# Patient Record
Sex: Male | Born: 1960 | Marital: Single | Smoking: Never smoker
Health system: Southern US, Community
[De-identification: ages and names within clinical notes are randomized; demographics above are authoritative.]

## PROBLEM LIST (undated history)

## (undated) DIAGNOSIS — M19011 Primary osteoarthritis, right shoulder: Secondary | ICD-10-CM

## (undated) DIAGNOSIS — N400 Enlarged prostate without lower urinary tract symptoms: Secondary | ICD-10-CM

## (undated) DIAGNOSIS — E785 Hyperlipidemia, unspecified: Secondary | ICD-10-CM

## (undated) DIAGNOSIS — N289 Disorder of kidney and ureter, unspecified: Secondary | ICD-10-CM

## (undated) DIAGNOSIS — D860 Sarcoidosis of lung: Secondary | ICD-10-CM

## (undated) DIAGNOSIS — I358 Other nonrheumatic aortic valve disorders: Secondary | ICD-10-CM

## (undated) DIAGNOSIS — C641 Malignant neoplasm of right kidney, except renal pelvis: Secondary | ICD-10-CM

## (undated) DIAGNOSIS — N189 Chronic kidney disease, unspecified: Secondary | ICD-10-CM

## (undated) DIAGNOSIS — I10 Essential (primary) hypertension: Secondary | ICD-10-CM

## (undated) HISTORY — PX: PELVIS CLOSED REDUCTION: SHX1023

## (undated) HISTORY — PX: KIDNEY SURGERY: SHX687

---

## 2020-08-25 ENCOUNTER — Other Ambulatory Visit: Payer: Self-pay | Admitting: Podiatry

## 2020-08-25 DIAGNOSIS — M79604 Pain in right leg: Secondary | ICD-10-CM

## 2020-08-25 DIAGNOSIS — M7989 Other specified soft tissue disorders: Secondary | ICD-10-CM

## 2020-08-26 ENCOUNTER — Ambulatory Visit: Admission: RE | Admit: 2020-08-26 | Payer: Commercial Managed Care - PPO | Source: Ambulatory Visit

## 2020-08-26 ENCOUNTER — Other Ambulatory Visit: Payer: Self-pay | Admitting: Podiatry

## 2020-08-26 ENCOUNTER — Ambulatory Visit
Admission: RE | Admit: 2020-08-26 | Discharge: 2020-08-26 | Disposition: A | Discharge status: Home / Self Care | Payer: Commercial Managed Care - PPO | Source: Ambulatory Visit | Attending: Podiatry | Admitting: Podiatry

## 2020-08-26 ENCOUNTER — Other Ambulatory Visit: Payer: Self-pay

## 2020-08-26 DIAGNOSIS — M79605 Pain in left leg: Secondary | ICD-10-CM

## 2020-08-26 DIAGNOSIS — M7989 Other specified soft tissue disorders: Secondary | ICD-10-CM | POA: Insufficient documentation

## 2020-08-26 DIAGNOSIS — M79604 Pain in right leg: Secondary | ICD-10-CM | POA: Insufficient documentation

## 2021-10-09 ENCOUNTER — Encounter (HOSPITAL_COMMUNITY): Payer: Self-pay

## 2021-10-09 ENCOUNTER — Emergency Department (HOSPITAL_COMMUNITY): Payer: Commercial Managed Care - PPO

## 2021-10-09 ENCOUNTER — Emergency Department (HOSPITAL_COMMUNITY)
Admission: EM | Admit: 2021-10-09 | Discharge: 2021-10-09 | Disposition: A | Discharge status: Home / Self Care | Payer: Commercial Managed Care - PPO | Attending: Emergency Medicine | Admitting: Emergency Medicine

## 2021-10-09 ENCOUNTER — Other Ambulatory Visit: Payer: Self-pay

## 2021-10-09 DIAGNOSIS — W01198A Fall on same level from slipping, tripping and stumbling with subsequent striking against other object, initial encounter: Secondary | ICD-10-CM | POA: Diagnosis not present

## 2021-10-09 DIAGNOSIS — R0781 Pleurodynia: Secondary | ICD-10-CM | POA: Insufficient documentation

## 2021-10-09 DIAGNOSIS — W19XXXA Unspecified fall, initial encounter: Secondary | ICD-10-CM

## 2021-10-09 DIAGNOSIS — M25511 Pain in right shoulder: Secondary | ICD-10-CM | POA: Insufficient documentation

## 2021-10-09 MED ORDER — CYCLOBENZAPRINE HCL 10 MG PO TABS
10.0000 mg | ORAL_TABLET | Freq: Once | ORAL | Status: DC
  Filled 2021-10-09: qty 1

## 2021-10-09 MED ORDER — OXYCODONE-ACETAMINOPHEN 5-325 MG PO TABS: 1.0000 | ORAL_TABLET | Freq: Three times a day (TID) | ORAL | 0 refills | Status: AC | PRN

## 2021-10-09 NOTE — ED Provider Notes (Signed)
?[place] EMERGENCY DEPARTMENT ?Provider Note ? ? ?CSN: [id number] ?Arrival date & time: 10/09/21  1015 ? ?  ? ?History ? ?Chief Complaint  ?Patient presents with  ? Fall  ? ? ?Dalton Guerrero is a 61 y.o. male. ? ?HPI ? ?Patient without  significant medical history presents with complaints of a mechanical fall.  Patient states on Friday he was holding a door for someone and his foot got caught on the carpet causing him to fall onto his right side.  He denies hitting his head, losing conscious, he is not on anticoag's.  He states that after the fall he has been having some right-sided rib pain and right-sided shoulder pain, pain has gotten worse,  worse with movement improved with rest.  He denies chest pain shortness of breath pleuritic chest pain denies any paresthesias in the upper extremities.  He does not endorse any neck back pain stomach pain nausea vomiting, no bloody sputum.  He has had nothing approved for pain at this time. ? ?I reviewed patient's chart patient is not on anticoag's. ? ?Home Medications ?Prior to Admission medications   ?Medication Sig Start Date End Date Taking? Authorizing Provider  ?oxyCODONE-acetaminophen (PERCOCET/ROXICET) 5-325 MG tablet Take 1 tablet by mouth every 8 (eight) hours as needed for up to 3 days for severe pain. 10/09/21 10/12/21 Yes Carroll Sage, PA-C  ?   ? ?Allergies    ?Patient has no known allergies.   ? ?Review of Systems   ?Review of Systems  ?Constitutional:  Negative for chills and fever.  ?Respiratory:  Negative for shortness of breath.   ?Cardiovascular:  Negative for chest pain.  ?Gastrointestinal:  Negative for abdominal pain, diarrhea and vomiting.  ?Musculoskeletal:   ?     Right-sided rib pain as well as right shoulder pain.  ?Neurological:  Negative for headaches.  ? ?Physical Exam ?Updated Vital Signs ?BP (!) 140/91   Pulse 81   Temp 98.2 ?F (36.8 ?C) (Oral)   Resp 15   Ht 5\' 11"  (1.803 m)   Wt 100.7 kg   SpO2 99%   BMI 30.96 kg/m?   ?Physical Exam ?Vitals and nursing note reviewed.  ?Constitutional:   ?   General: He is not in acute distress. ?   Appearance: He is not ill-appearing.  ?HENT:  ?   Head: Normocephalic and atraumatic.  ?   Comments: No deformity of the head present no raccoon eyes or battle sign noted. ?   Nose: No congestion.  ?   Mouth/Throat:  ?   Comments: No trismus no torticollis no oral trauma ?Eyes:  ?   Conjunctiva/sclera: Conjunctivae normal.  ?Cardiovascular:  ?   Rate and Rhythm: Normal rate and regular rhythm.  ?   Pulses: Normal pulses.  ?   Heart sounds: No murmur heard. ?  No friction rub. No gallop.  ?Pulmonary:  ?   Effort: No respiratory distress.  ?   Breath sounds: No wheezing, rhonchi or rales.  ?   Comments: Chest was palpated he had noted tenderness on the right anterior midclavicular the fifth and sixth rib no crepitus no flail chest ?Abdominal:  ?   Palpations: Abdomen is soft.  ?   Tenderness: There is no abdominal tenderness. There is no right CVA tenderness or left CVA tenderness.  ?Musculoskeletal:  ?   Right lower leg: No edema.  ?   Left lower leg: No edema.  ?   Comments: Spine was palpated was nontender to palpation  no step-off deformities noted patient's right shoulder was palpated he had noted tenderness on the anterior aspect of the right humeral head no crepitus present.  He has full range of motion in all 4 extremities neurovascular fully intact.  No pelvis instability no leg shortening  ?Skin: ?   General: Skin is warm and dry.  ?Neurological:  ?   Mental Status: He is alert.  ?   Comments: No facial asymmetry no difficulty with word finding following two-step commands no regular weakness present  ?Psychiatric:     ?   Mood and Affect: Mood normal.  ? ? ?ED Results / Procedures / Treatments   ?Labs ?(all labs ordered are listed, but only abnormal results are displayed) ?Labs Reviewed - No data to display ? ?EKG ?None ? ?Radiology ?DG Ribs Unilateral W/Chest Right ? ?Result Date:  10/09/2021 ?CLINICAL DATA:  Fall with chest and shoulder pain. EXAM: RIGHT RIBS AND CHEST - 3+ VIEW COMPARISON:  Report from chest radiograph dated 09/04/2016. FINDINGS: No fracture or other bone lesions are seen involving the ribs. There is mild left basilar atelectasis. The right lung is clear. There is no evidence of pneumothorax or pleural effusion. Both lungs are clear. Heart size and mediastinal contours are within normal limits. Vascular calcifications are seen in the aortic arch. IMPRESSION: Mild left basilar atelectasis.  No acute osseous injury. Aortic Atherosclerosis (ICD10-I70.0). Electronically Signed   By: Zerita Boers M.D.   On: 10/09/2021 12:21  ? ?DG Shoulder Right ? ?Result Date: 10/09/2021 ?CLINICAL DATA:  Fall, shoulder pain EXAM: RIGHT SHOULDER - 2+ VIEW COMPARISON:  None. FINDINGS: There is no acute fracture or dislocation. Glenohumeral and acromioclavicular alignment is maintained. There is mild degenerative change about the [facility] joint. The glenohumeral joint appears preserved. The soft tissues are unremarkable. IMPRESSION: No acute fracture or dislocation. Electronically Signed   By: Valetta Mole M.D.   On: 10/09/2021 12:20   ? ?Procedures ?Procedures  ? ? ?Medications Ordered in ED ?Medications  ?cyclobenzaprine (FLEXERIL) tablet 10 mg (10 mg Oral Patient Refused/Not Given 10/09/21 1133)  ? ? ?ED Course/ Medical Decision Making/ A&P ?  ?                        ?Medical Decision Making ?Amount and/or Complexity of Data Reviewed ?Radiology: ordered. ? ?Risk ?Prescription drug management. ? ? ?This patient presents to the ED for concern of fall, this involves an extensive number of treatment options, and is a complaint that carries with it a high risk of complications and morbidity.  The differential diagnosis includes fracture, dislocation, pneumothorax ? ? ? ?Additional history obtained: ? ?Additional history obtained from electronic medical record ?External records from outside source obtained  and reviewed including N/A ? ? ?Co morbidities that complicate the patient evaluation ? ?N/A ? ?Social Determinants of Health: ? ?N/A ? ? ? ?Lab Tests: ? ?I Ordered, and personally interpreted labs.  The pertinent results include: N/A ? ? ?Imaging Studies ordered: ? ?I ordered imaging studies including x-ray of the right shoulder and right ribs ?I independently visualized and interpreted imaging which showed imaging negative for acute findings ?I agree with the radiologist interpretation ? ? ?Cardiac Monitoring: ? ?The patient was maintained on a cardiac monitor.  I personally viewed and interpreted the cardiac monitored which showed an underlying rhythm of: N/A ? ? ?Medicines ordered and prescription drug management: ? ?I ordered medication including Flexeril for pain ?I have reviewed the patients home  medicines and have made adjustments as needed ? ?Critical Interventions: ? ?N/A ? ? ?Reevaluation: ? ?Presents every mechanical fall I am concerned for possible orthopedic injury will obtain imaging of the right shoulder and ribs for further evaluation. ? ?Reassessed updated on imaging lab work imaging patient is ready for discharge. ? ?Consultations Obtained: ? ?N/A ? ? ? ?Test Considered: ? ?CT of chest will defer/suspicion for multiple rib fractures are very low at this time, he is nontachypneic nonhypoxic tenderness as well as over fifth and sixth rib.  ? ? ? ?Rule out ?low suspicion for intracranial head bleed as patient denies loss of conscious, is not on anticoagulant, she does not endorse headaches, paresthesia/weakness in the upper and lower extremities, no focal deficits present on my exam.  Low suspicion for spinal cord abnormality or spinal fracture spine was palpated was nontender to palpation, patient has full range of motion in the upper and lower extremities.  Low suspicion for pneumothorax as lung sounds are clear bilaterally, x-ray is negative for acute findings.  Low suspicion for intra-abdominal  trauma as abdomen soft nontender to palpation.  Low suspicion for orthopedic injury as imaging is negative for acute findings. ? ? ? ? ?Dispostion and problem list ? ?After consideration of the diagnostic results and the patients

## 2021-10-09 NOTE — Discharge Instructions (Signed)
Imaging was negative for acute findings, I suspect you  have a bruised rib, I given a incentive spirometer please use 3 times daily for next 3 weeks as this will prevent pneumonia.  I have given you some pain medications do not take this while taking tramadol, I recommend taking 1 or the other. ? ?I have given you a short course of narcotics please take as prescribed.  This medication can make you drowsy do not consume alcohol or operate heavy machinery when taking this medication.  This medication is Tylenol in it do not take Tylenol and take this medication.   ? ?Please follow-up with your PCP in 3 weeks time for reevaluation. ? ?Come back to the emergency department if you develop chest pain, shortness of breath, severe abdominal pain, uncontrolled nausea, vomiting, diarrhea. ? ?

## 2021-10-09 NOTE — ED Triage Notes (Signed)
Pt states he was at work Friday and tripped and fell over a floor mat. Pt states since the fall he has been experiencing pain to right side of ribs, right should, bilateral elbows, right knee, and right ankle. Did not hit his head, no loc, no blood thinners.  ?

## 2021-10-09 NOTE — ED Notes (Signed)
IS: 2200 ?

## 2021-12-29 ENCOUNTER — Ambulatory Visit: Payer: Commercial Managed Care - PPO | Attending: Family Medicine

## 2022-05-15 ENCOUNTER — Encounter (HOSPITAL_COMMUNITY): Payer: Self-pay

## 2022-05-15 ENCOUNTER — Emergency Department (HOSPITAL_COMMUNITY)
Admission: EM | Admit: 2022-05-15 | Discharge: 2022-05-15 | Discharge status: Against Medical Advice | Payer: BLUE CROSS/BLUE SHIELD | Attending: Emergency Medicine | Admitting: Emergency Medicine

## 2022-05-15 ENCOUNTER — Emergency Department (HOSPITAL_COMMUNITY): Payer: BLUE CROSS/BLUE SHIELD

## 2022-05-15 DIAGNOSIS — I10 Essential (primary) hypertension: Secondary | ICD-10-CM | POA: Insufficient documentation

## 2022-05-15 DIAGNOSIS — Z20822 Contact with and (suspected) exposure to covid-19: Secondary | ICD-10-CM | POA: Insufficient documentation

## 2022-05-15 DIAGNOSIS — Z5329 Procedure and treatment not carried out because of patient's decision for other reasons: Secondary | ICD-10-CM | POA: Diagnosis not present

## 2022-05-15 DIAGNOSIS — Z85528 Personal history of other malignant neoplasm of kidney: Secondary | ICD-10-CM | POA: Insufficient documentation

## 2022-05-15 DIAGNOSIS — R0602 Shortness of breath: Secondary | ICD-10-CM | POA: Insufficient documentation

## 2022-05-15 DIAGNOSIS — R531 Weakness: Secondary | ICD-10-CM | POA: Insufficient documentation

## 2022-05-15 DIAGNOSIS — R059 Cough, unspecified: Secondary | ICD-10-CM | POA: Diagnosis not present

## 2022-05-15 DIAGNOSIS — Z862 Personal history of diseases of the blood and blood-forming organs and certain disorders involving the immune mechanism: Secondary | ICD-10-CM

## 2022-05-15 DIAGNOSIS — Z8709 Personal history of other diseases of the respiratory system: Secondary | ICD-10-CM | POA: Diagnosis not present

## 2022-05-15 HISTORY — DX: Essential (primary) hypertension: I10

## 2022-05-15 LAB — RESP PANEL BY RT-PCR (FLU A&B, COVID) ARPGX2
Influenza A by PCR: NEGATIVE
Influenza B by PCR: NEGATIVE
SARS Coronavirus 2 by RT PCR: NEGATIVE

## 2022-05-15 NOTE — ED Provider Notes (Signed)
[facility] EMERGENCY DEPARTMENT Provider Note   CSN: [id number] Arrival date & time: 05/15/22  1003     History  Chief Complaint  Patient presents with   Weakness    Dalton Guerrero is a 61 y.o. male.  61 year old male with a history of renal cell carcinoma status post nephrectomy on 03/28/2022 (not currently on chemo or radiation), pulmonary sarcoidosis, ANCA vasculitis who presents to the emergency department with cough productive of brown sputum.  Patient states that last Wednesday he started developing generalized weakness and cough.  Says that it started after he went to a store where he thinks he could have been exposed to [place].  Also reports changes in his taste and smell since then.  Says that he has had fevers and chills.  Denies any chest pain.  Says that he decided to come in today for evaluation with his persistent symptoms.  No lower extremity swelling.  Follows with Dr. Candis Schatz from [facility] rheumatology and Dr. Wynonia Lawman from pulmonology.  Says he is not currently on any medications for his sarcoidosis or vasculitis that he is aware of.  Also reports mild shortness of breath.   Past Medical History:  Diagnosis Date   Hypertension       Home Medications Prior to Admission medications   Not on File      Allergies    Patient has no known allergies.    Review of Systems   Review of Systems  Physical Exam Updated Vital Signs BP 107/82   Pulse 80   Temp 98.4 F (36.9 C) (Oral)   SpO2 100%  Physical Exam Vitals and nursing note reviewed.  Constitutional:      General: He is not in acute distress.    Appearance: He is well-developed.  HENT:     Head: Normocephalic and atraumatic.     Right Ear: External ear normal.     Left Ear: External ear normal.     Nose: Nose normal.  Eyes:     Extraocular Movements: Extraocular movements intact.     Conjunctiva/sclera: Conjunctivae normal.     Pupils: Pupils are equal, round, and reactive to light.  Cardiovascular:      Rate and Rhythm: Normal rate and regular rhythm.     Heart sounds: Normal heart sounds.  Pulmonary:     Effort: Pulmonary effort is normal. No respiratory distress.     Breath sounds: Normal breath sounds.  Musculoskeletal:        General: No swelling.     Cervical back: Normal range of motion and neck supple.     Right lower leg: No edema.     Left lower leg: No edema.  Skin:    General: Skin is warm and dry.  Neurological:     Mental Status: He is alert and oriented to person, place, and time. Mental status is at baseline.  Psychiatric:        Mood and Affect: Mood normal.        Behavior: Behavior normal.     ED Results / Procedures / Treatments   Labs (all labs ordered are listed, but only abnormal results are displayed) Labs Reviewed  RESP PANEL BY RT-PCR (FLU A&B, COVID) [id number]    EKG None  Radiology No results found.  Procedures Procedures   Medications Ordered in ED Medications - No data to display  ED Course/ Medical Decision Making/ A&P  Medical Decision Making Amount and/or Complexity of Data Reviewed Radiology: ordered.   Dalton Guerrero is a 61 y.o. male with comorbidities that complicate the patient evaluation including renal cell carcinoma, pulmonary sarcoidosis, and ANCA vasculitis who presents with chief complaint of generalized fatigue and cough.  This patient presents to the ED for concern of complaints listed in HPI, this involves an extensive number of treatment options, and is a complaint that carries with it a high risk of complications and morbidity. Disposition including potential need for admission considered.   Initial Ddx:  Viral URI, pneumonia, sarcoidosis flare, PE, MI  MDM:  Feel the patient is likely having a viral URI or pneumonia given his symptoms.  Also could be worsening of his sarcoidosis.  Feel that pulmonary embolism is also on the differential given his cancer history.  Considered MI but feel  this is likely likely given the lack of chest discomfort and very mild shortness of breath.   Plan:  Labs COVID and flu EKG CTA  ED Summary/Re-evaluation:  Patient reevaluated after initial COVID and flu and chest x-ray were ordered from triage.  He is stable.  He was notified of his lung nodule.  Did inform the patient that we wanted to pursue further work-up with lab work and CTA but the patient eloped prior to finishing his work-up.  Patient was of sound mind and had capacity to leave the department at that time.  Dispo: Eloped   Records reviewed Outpatient Clinic Notes I independently reviewed the following imaging with scope of interpretation limited to determining acute life threatening conditions related to emergency care: Chest x-ray, which revealed no acute abnormality  I personally reviewed and interpreted cardiac monitoring: normal sinus rhythm   Final Clinical Impression(s) / ED Diagnoses Final diagnoses:  Cough, unspecified type  Shortness of breath  History of sarcoidosis    Rx / DC Orders ED Discharge Orders     None         Fransico Meadow, MD 05/17/22 1431

## 2022-05-15 NOTE — ED Triage Notes (Signed)
Patient having "weakness, funny taste in mouth, and not much of a sense of smell/taste." Patient notes he does not have any shortness of breath. Also notes coughing up green/brown sputum.

## 2022-07-06 ENCOUNTER — Ambulatory Visit (HOSPITAL_COMMUNITY): Payer: BLUE CROSS/BLUE SHIELD | Admitting: Occupational Therapy

## 2022-07-11 ENCOUNTER — Ambulatory Visit (HOSPITAL_COMMUNITY): Payer: BLUE CROSS/BLUE SHIELD | Attending: Family Medicine | Admitting: Occupational Therapy

## 2022-07-11 ENCOUNTER — Encounter (HOSPITAL_COMMUNITY): Payer: Self-pay | Admitting: Occupational Therapy

## 2022-07-11 DIAGNOSIS — R29898 Other symptoms and signs involving the musculoskeletal system: Secondary | ICD-10-CM | POA: Insufficient documentation

## 2022-07-11 DIAGNOSIS — M25511 Pain in right shoulder: Secondary | ICD-10-CM | POA: Diagnosis present

## 2022-07-11 DIAGNOSIS — R29818 Other symptoms and signs involving the nervous system: Secondary | ICD-10-CM | POA: Insufficient documentation

## 2022-07-11 DIAGNOSIS — G8929 Other chronic pain: Secondary | ICD-10-CM | POA: Diagnosis present

## 2022-07-11 DIAGNOSIS — M25632 Stiffness of left wrist, not elsewhere classified: Secondary | ICD-10-CM | POA: Insufficient documentation

## 2022-07-11 DIAGNOSIS — M25532 Pain in left wrist: Secondary | ICD-10-CM | POA: Diagnosis present

## 2022-07-11 NOTE — Patient Instructions (Signed)
Weight-bearing   1) Weight-bearing lean stretch: From a seated position on your bed or bench, prop yourself up on your affected arm by placing your affected arm about a foot away from your body. Then lean into it.  Hold for at least 1 minute    OR     2) Weight-bearing in standing:  Gently lean your body weight into your hand or fist. Keep the elbow as straight as possible. Hold for at least 1 minute.

## 2022-07-11 NOTE — Therapy (Signed)
OUTPATIENT OCCUPATIONAL THERAPY ORTHO EVALUATION  Patient Name: Dalton Guerrero MRN: [id number] DOB:1960/12/01, 61 y.o., male Today's Date: 07/11/2022  PCP: Dr. Gwen Pounds REFERRING PROVIDER: Bethann Berkshire, PA-C  END OF SESSION:  OT End of Session - 07/11/22 1056     Visit Number 1    Number of Visits 16    Date for OT Re-Evaluation 09/09/22   mini-reassessment 08/08/22   Authorization Type [employer]    OT Start Time 0935    OT Stop Time 1025    OT Time Calculation (min) 50 min    Activity Tolerance Patient tolerated treatment well    Behavior During Therapy WFL for tasks assessed/performed             Past Medical History:  Diagnosis Date   Hypertension    Past Surgical History:  Procedure Laterality Date   KIDNEY SURGERY Bilateral    Pt. 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   ONSET DATE: 02/28/22  REFERRING DIAG: right RC tear, left radial nerve palsy, left wrist contracture  THERAPY DIAG:  Pain in left wrist  Stiffness of left wrist, not elsewhere classified  Other symptoms and signs involving the nervous system  Other symptoms and signs involving the musculoskeletal system  Chronic right shoulder pain  Rationale for Evaluation and Treatment: Rehabilitation  SUBJECTIVE:   SUBJECTIVE STATEMENT: S: I fell asleep and woke up with my hand like this.  Pt accompanied by: self  PERTINENT HISTORY: Pt is a 61 y/o male presenting with right RC tear, left radial nerve palsy, and left wrist contracture. Pt presents in a standard prefabricated wrist splint today, minimal benefit to wrist and left hand. Pt fell several years ago landing on his right shoulder, then had surgery. In March, pt fell at work when tripping over a mat, then later tried to lift a box overhead and sustained a RC tear. Pt was scheduled for a reverse TSA, but has postponed due to the severity of deficits with his left hand and wrist. Pt has had a  nerve conduction study completed, pt reports he was told that it would be 6 months before he saw improvement. Currently, the pt's primary concern is his left hand and wrist, this is where he would like therapy to focus.   PRECAUTIONS: None  WEIGHT BEARING RESTRICTIONS: No  PAIN:  Are you having pain? Yes: NPRS scale: 9/10 Pain location: right shoulder Pain description: aching, sharp, sore Aggravating factors: movement, use, lifting Relieving factors: nothing  FALLS: Has patient fallen in last 6 months? Yes. Number of falls 2  PLOF: Independent  PATIENT GOALS: To get his left hand working again.   NEXT MD VISIT: unsure  OBJECTIVE:   HAND DOMINANCE: Left  ADLs: Overall ADLs: Pt reports he can wash the left side of his body and he can lift a lightweight milk jug with his left hand, otherwise he is unable to use his left hand functionally. He has been soaking his hand in epsom salts and hot water to stretch it.   FUNCTIONAL OUTCOME MEASURES: Quick Dash: 81.82  UPPER EXTREMITY ROM:     Active ROM Left eval  Elbow flexion 130  Elbow extension 0  Wrist flexion 50  Wrist extension 0  Wrist ulnar deviation 0  Wrist radial deviation 0  Wrist pronation 45  Wrist supination 90  (Blank rows = not tested)  Active ROM Left eval  Thumb MCP (0-60) 72  Thumb  IP (0-80) 21  Thumb Opposition to Small Finger  0  Index MCP (0-90)  72  Index PIP (0-100)  62  Index DIP (0-70)  30  Long MCP (0-90)  68  Long PIP (0-100)  62  Long DIP (0-70)  55  Ring MCP (0-90)  58  Ring PIP (0-100)  82  Ring DIP (0-70)  40  Little MCP (0-90)  74  Little PIP (0-100)  58  Little DIP (0-70)  45  (Blank rows = not tested)   UPPER EXTREMITY MMT:     MMT Left eval  Elbow flexion 5/5  Elbow extension 5/5  Wrist flexion 3-/5  Wrist extension 1/5  Wrist ulnar deviation 0/5  Wrist radial deviation 0/5  Wrist pronation 2/5  Wrist supination 3-/5  (Blank rows = not tested)  HAND  FUNCTION: Grip strength: Right: 68 lbs; Left: 10 lbs, Lateral pinch: Right: 13 lbs, Left: 6 lbs, and 3 point pinch: Right: 14 lbs, Left: 1 lbs  COORDINATION: Unable to complete due to deficits  SENSATION: Light touch: WFL  EDEMA: None noted  COGNITION: Overall cognitive status: Difficulty to assess due to: lack of sleep, emotional, tangential   TODAY'S TREATMENT:                                                                                                                              DATE: N/A-eval only     PATIENT EDUCATION: Education details: Weightbearing-adapted for leaning on forearm versus hand Person educated: Patient Education method: Explanation, Demonstration, and Handouts Education comprehension: verbalized understanding and returned demonstration  HOME EXERCISE PROGRAM: Eval: weightbearing  GOALS: Goals reviewed with patient? Yes  SHORT TERM GOALS: Target date: 08/08/22  Pt will be provided with and educated on HEP to improve mobility in LUE required for use during ADL completion.   Goal status: INITIAL  2.  Pt will be provided with and educated on splint wear and care for radial nerve palsy, demonstrating independence in donning and doffing.   Goal status: INITIAL  3.  Pt will increase left wrist strength to 3+/5 to improve ability to reach for items using his left hand and maintain a light grasp.   Goal status: INITIAL  4.  Pt will increase left grip strength by 5# and pinch strength by 2# to improve ability to grasp and maintain hold on tools for ADL completion such as a cup, toothbrush, clothing, etc.   Goal status: INITIAL    LONG TERM GOALS: Target date: 09/09/22  Pt will decrease pain in LUE to 3/10 or less to improve ability to sleep for 2+ consecutive hours without waking due to pain.   Goal status: INITIAL  2.  Pt will decrease LUE fascial restrictions to min amounts or less to improve mobility required for functional reaching tasks.    Goal status: INITIAL  3.  Pt will increase LUE A/ROM by at least 25 degrees to improve ability to use  LUE when reaching overhead or behind back during dressing and bathing tasks.   Goal status: INITIAL  4.  Pt will increase LUE strength to 4/5 or greater to improve ability to use LUE when lifting or carrying items during meal preparation/housework/yardwork tasks.   Goal status: INITIAL  5.  Pt will return to highest level of function using LUE as dominant during functional task completion.   Goal status: INITIAL      ASSESSMENT:  CLINICAL IMPRESSION: Patient is a 61 y.o. male who was seen today for occupational therapy evaluation for right RC tear, left radial nerve palsy, left wrist contracture. Pt with tangential speech and inability to maintain focus, requiring frequent redirection back to task. Pt reports radial nerve palsy began in 02/2022, however with a hx of other issues and falls so exact date of onset is unclear. Pt emotional at one point during evaluation, stating he doesn't understand why this has happened to him. Pt reports he has not slept in 10+ days. Pt reporting he wants to work on his left arm at this time, not the right. OT repeated back to pt who confirmed, only wants to work on the left hand/wrist right now. Pt is left hand dominant and is unable to use the LUE functionally, therefore cannot have surgery on the right shoulder until the left hand is functional.   PERFORMANCE DEFICITS: in functional skills including ADLs, IADLs, coordination, dexterity, proprioception, sensation, edema, tone, ROM, strength, pain, fascial restrictions, Fine motor control, endurance, and UE functional use, cognitive skills including attention, emotional, and thought, and psychosocial skills including coping strategies.   IMPAIRMENTS: are limiting patient from ADLs, IADLs, rest and sleep, work, and leisure.   COMORBIDITIES: may have co-morbidities  that affects occupational performance.  Patient will benefit from skilled OT to address above impairments and improve overall function.  MODIFICATION OR ASSISTANCE TO COMPLETE EVALUATION: No modification of tasks or assist necessary to complete an evaluation.  OT OCCUPATIONAL PROFILE AND HISTORY: Detailed assessment: Review of records and additional review of physical, cognitive, psychosocial history related to current functional performance.  CLINICAL DECISION MAKING: LOW - limited treatment options, no task modification necessary  REHAB POTENTIAL: Fair dependent on nerve regeneration  EVALUATION COMPLEXITY: Low      PLAN:  OT FREQUENCY: 2x/week  OT DURATION: 8 weeks  PLANNED INTERVENTIONS: self care/ADL training, therapeutic exercise, therapeutic activity, neuromuscular re-education, manual therapy, passive range of motion, splinting, electrical stimulation, paraffin, patient/family education, and DME and/or AE instructions  RECOMMENDED OTHER SERVICES: follow up with MD regarding sleep  CONSULTED AND AGREED WITH PLAN OF CARE: Patient  PLAN FOR NEXT SESSION: Follow up on HEP, provide splint and fit if it has arrived. Begin working on P/ROM of hand and wrist, trial NMES for wrist extension   Ezra Sites, OTR/L  [phone] 07/11/2022, 10:57 AM

## 2022-07-19 ENCOUNTER — Encounter (HOSPITAL_COMMUNITY): Payer: BLUE CROSS/BLUE SHIELD | Admitting: Occupational Therapy

## 2022-07-21 ENCOUNTER — Encounter (HOSPITAL_COMMUNITY): Payer: Self-pay | Admitting: Occupational Therapy

## 2022-07-21 ENCOUNTER — Ambulatory Visit (HOSPITAL_COMMUNITY): Payer: BLUE CROSS/BLUE SHIELD | Attending: Family Medicine | Admitting: Occupational Therapy

## 2022-07-21 DIAGNOSIS — M25632 Stiffness of left wrist, not elsewhere classified: Secondary | ICD-10-CM | POA: Insufficient documentation

## 2022-07-21 DIAGNOSIS — R29818 Other symptoms and signs involving the nervous system: Secondary | ICD-10-CM | POA: Insufficient documentation

## 2022-07-21 DIAGNOSIS — R29898 Other symptoms and signs involving the musculoskeletal system: Secondary | ICD-10-CM | POA: Insufficient documentation

## 2022-07-21 DIAGNOSIS — M25532 Pain in left wrist: Secondary | ICD-10-CM | POA: Insufficient documentation

## 2022-07-21 NOTE — Therapy (Signed)
OUTPATIENT OCCUPATIONAL THERAPY ORTHO TREATMENT  Patient Name: Dalton Guerrero MRN: [id number] DOB:11-03-1960, 62 y.o., male Today's Date: 07/21/2022  PCP: Dr. Modena Morrow REFERRING PROVIDER: [name], PA-C  END OF SESSION:  OT End of Session - 07/21/22 0829     Visit Number 2    Number of Visits 16    Date for OT Re-Evaluation 09/09/22   mini-reassessment 08/08/22   Authorization Type [employer]    OT Start Time 0820    OT Stop Time 0900    OT Time Calculation (min) 40 min    Activity Tolerance Patient tolerated treatment well    Behavior During Therapy WFL for tasks assessed/performed             Past Medical History:  Diagnosis Date   Hypertension    Past Surgical History:  Procedure Laterality Date   KIDNEY SURGERY Bilateral    Pt. 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   ONSET DATE: 02/28/22  REFERRING DIAG: right RC tear, left radial nerve palsy, left wrist contracture  THERAPY DIAG:  Pain in left wrist  Stiffness of left wrist, not elsewhere classified  Other symptoms and signs involving the nervous system  Other symptoms and signs involving the musculoskeletal system  Rationale for Evaluation and Treatment: Rehabilitation  SUBJECTIVE:   SUBJECTIVE STATEMENT: S: I fell asleep and woke up with my hand like this.  Pt accompanied by: self  PERTINENT HISTORY: Pt is a 62 y/o male presenting with right RC tear, left radial nerve palsy, and left wrist contracture. Pt presents in a standard prefabricated wrist splint today, minimal benefit to wrist and left hand. Pt fell several years ago landing on his right shoulder, then had surgery. In March, pt fell at work when tripping over a mat, then later tried to lift a box overhead and sustained a RC tear. Pt was scheduled for a reverse TSA, but has postponed due to the severity of deficits with his left hand and wrist. Pt has had a nerve conduction study completed, pt  reports he was told that it would be 6 months before he saw improvement. Currently, the pt's primary concern is his left hand and wrist, this is where he would like therapy to focus.   PRECAUTIONS: None  WEIGHT BEARING RESTRICTIONS: No  PAIN:  Are you having pain? Yes: NPRS scale: 9/10 Pain location: right shoulder Pain description: aching, sharp, sore Aggravating factors: movement, use, lifting Relieving factors: nothing  FALLS: Has patient fallen in last 6 months? Yes. Number of falls 2  PLOF: Independent  PATIENT GOALS: To get his left hand working again.   NEXT MD VISIT: unsure  OBJECTIVE:   HAND DOMINANCE: Left  ADLs: Overall ADLs: Pt reports he can wash the left side of his body and he can lift a lightweight milk jug with his left hand, otherwise he is unable to use his left hand functionally. He has been soaking his hand in epsom salts and hot water to stretch it.   FUNCTIONAL OUTCOME MEASURES: Quick Dash: 81.82  UPPER EXTREMITY ROM:     Active ROM Left eval  Elbow flexion 130  Elbow extension 0  Wrist flexion 50  Wrist extension 0  Wrist ulnar deviation 0  Wrist radial deviation 0  Wrist pronation 45  Wrist supination 90  (Blank rows = not tested)  Active ROM Left eval  Thumb MCP (0-60) 72  Thumb IP (0-80) 21  Thumb  Opposition to Small Finger  0  Index MCP (0-90)  72  Index PIP (0-100)  62  Index DIP (0-70)  30  Long MCP (0-90)  68  Long PIP (0-100)  62  Long DIP (0-70)  55  Ring MCP (0-90)  58  Ring PIP (0-100)  82  Ring DIP (0-70)  40  Little MCP (0-90)  74  Little PIP (0-100)  58  Little DIP (0-70)  45  (Blank rows = not tested)   UPPER EXTREMITY MMT:     MMT Left eval  Elbow flexion 5/5  Elbow extension 5/5  Wrist flexion 3-/5  Wrist extension 1/5  Wrist ulnar deviation 0/5  Wrist radial deviation 0/5  Wrist pronation 2/5  Wrist supination 3-/5  (Blank rows = not tested)  HAND FUNCTION: Grip strength: Right: 68 lbs; Left: 10  lbs, Lateral pinch: Right: 13 lbs, Left: 6 lbs, and 3 point pinch: Right: 14 lbs, Left: 1 lbs  COORDINATION: Unable to complete due to deficits  SENSATION: Light touch: WFL  EDEMA: None noted  COGNITION: Overall cognitive status: Difficulty to assess due to: lack of sleep, emotional, tangential   TODAY'S TREATMENT:                                                                                                                              DATE:  07/21/22 -Weight bearing through elbow, forearm supinated x60" and forearm pronated x60" -Fitted radial palsy splint and educated on schedule and maintenance, as well as doffing and donning x4 -radial nerve tendon glide x10 -Wrist A/ROM: flexion, extension, ulnar deviation, radial deviation, supination, pronation, x10 -Digit ROM: composite flexion, extension, abduction/adduction, digit opposition    PATIENT EDUCATION: Education details: Radial Nerve Tendon Glide Person educated: Patient Education method: Explanation, Demonstration, and Handouts Education comprehension: verbalized understanding and returned demonstration  HOME EXERCISE PROGRAM: Eval: weightbearing 1/5: Radial Nerve Tendon Glide  GOALS: Goals reviewed with patient? Yes  SHORT TERM GOALS: Target date: 08/08/22  Pt will be provided with and educated on HEP to improve mobility in LUE required for use during ADL completion.   Goal status: IN PROGRESS  2.  Pt will be provided with and educated on splint wear and care for radial nerve palsy, demonstrating independence in donning and doffing.   Goal status: IN PROGRESS  3.  Pt will increase left wrist strength to 3+/5 to improve ability to reach for items using his left hand and maintain a light grasp.   Goal status: IN PROGRESS  4.  Pt will increase left grip strength by 5# and pinch strength by 2# to improve ability to grasp and maintain hold on tools for ADL completion such as a cup, toothbrush, clothing, etc.    Goal status: IN PROGRESS    LONG TERM GOALS: Target date: 09/09/22  Pt will decrease pain in LUE to 3/10 or less to improve ability to sleep for 2+ consecutive hours without waking due  to pain.   Goal status: IN PROGRESS  2.  Pt will decrease LUE fascial restrictions to min amounts or less to improve mobility required for functional reaching tasks.   Goal status: IN PROGRESS  3.  Pt will increase LUE A/ROM by at least 25 degrees to improve ability to use LUE when reaching overhead or behind back during dressing and bathing tasks.   Goal status: IN PROGRESS  4.  Pt will increase LUE strength to 4/5 or greater to improve ability to use LUE when lifting or carrying items during meal preparation/housework/yardwork tasks.   Goal status: IN PROGRESS  5.  Pt will return to highest level of function using LUE as dominant during functional task completion.   Goal status: IN PROGRESS      ASSESSMENT:  CLINICAL IMPRESSION: This session pt working on improving ROM of wrist and hand, as well as working on tendon glides for the radial nerve. When pt was completing wrist A/ROM he reported some pain with flexion and radial deviation, however he stated that he could work through it. Pt was provided a radial nerve splint for his wrist, to limit extension and assist with extending his digits. He required max assist and multiple attempts to don and doff the splint independently. OT providing verbal and tactile cuing for positioning and techniques.    PERFORMANCE DEFICITS: in functional skills including ADLs, IADLs, coordination, dexterity, proprioception, sensation, edema, tone, ROM, strength, pain, fascial restrictions, Fine motor control, endurance, and UE functional use, cognitive skills including attention, emotional, and thought, and psychosocial skills including coping strategies.     PLAN:  OT FREQUENCY: 2x/week  OT DURATION: 8 weeks  PLANNED INTERVENTIONS: self care/ADL training,  therapeutic exercise, therapeutic activity, neuromuscular re-education, manual therapy, passive range of motion, splinting, electrical stimulation, paraffin, patient/family education, and DME and/or AE instructions  RECOMMENDED OTHER SERVICES: follow up with MD regarding sleep  CONSULTED AND AGREED WITH PLAN OF CARE: Patient  PLAN FOR NEXT SESSION: Follow up on HEP, check on splint, Begin working on P/ROM of hand and wrist, trial NMES for wrist extension, tendon glides   [facility] [phone] 07/21/2022, 8:33 AM

## 2022-07-21 NOTE — Patient Instructions (Signed)
Exercise Program for Radial Tunnel Syndrome STRETCHING EXERCISES 4. Radial Nerve Glides_______________________________________________________________ Equipment needed: None Additional instructions: Nerve glides help to restore nerve motion. This exercise will help the  radial nerve glide normally through structures that are putting pressure on the nerve. Step-by-step directions  Stand comfortably with your arms loose at  your sides.  Drop your shoulder down and reach your  fingers toward the floor.  Internally rotate your arm (thumb toward  your body) and flex your wrist with the palm  up.  Gently tilt your head away from the side you  are stretching.  Raise your arm up and away from your body as you continue to flex your wrist and tilt your head.  Hold each position of the glide for 3 to 5 seconds. Days per week 5 to 7  Tip Stretch only to the point where you feel tension.

## 2022-07-28 ENCOUNTER — Encounter (HOSPITAL_COMMUNITY): Payer: Self-pay | Admitting: Occupational Therapy

## 2022-07-28 ENCOUNTER — Ambulatory Visit (HOSPITAL_COMMUNITY): Payer: BLUE CROSS/BLUE SHIELD | Admitting: Occupational Therapy

## 2022-07-28 DIAGNOSIS — M25532 Pain in left wrist: Secondary | ICD-10-CM | POA: Diagnosis not present

## 2022-07-28 DIAGNOSIS — R29818 Other symptoms and signs involving the nervous system: Secondary | ICD-10-CM

## 2022-07-28 DIAGNOSIS — M25632 Stiffness of left wrist, not elsewhere classified: Secondary | ICD-10-CM

## 2022-07-28 NOTE — Patient Instructions (Signed)
Complete each exercise 10-15X, 2-3X/day  1) Towel crunch Place a small towel on a firm table top. Flatten out the towel and then place your hand on one end of it.  Next, flex your fingers 2-5 (index finger through pinky finger) as you pull the towel towards your hand.    2) Digit composite flexion/adduction (make a fist) Hold your hand up as shown. Open and close your hand into a fist and repeat. If you cannot make a full fist, then make a partial fist.    3) Thumb/finger opposition Touch the tip of the thumb to each fingertip one by one. Extend fingers fully after they are touched.      4) Finger Taps Start with the hand flat and fingers slightly spread.  One at a time, starting with the thumb, lift each finger up separately.    5) PIP Joint Blocking Grasp the affected finger, bracing below the middle knuckle, and actively bend the finger as shown.    6) DIP Joint Blocking Grasp the affected finger, bracing below the last knuckle, and actively bend the finger at the last joint.     7) Digit Abduction/Adduction Hold hand palm down flat on table. Spread your fingers apart and back together.   

## 2022-07-28 NOTE — Therapy (Unsigned)
OUTPATIENT OCCUPATIONAL THERAPY ORTHO TREATMENT  Patient Name: Dalton Guerrero MRN: [id number] DOB:[birth year], 61 y.o., male Today's Date: 07/28/2022  PCP: Dr. Gwen Pounds REFERRING PROVIDER: Bethann Berkshire, PA-C  END OF SESSION:  OT End of Session - 07/28/22 0734     Visit Number 3    Number of Visits 16    Date for OT Re-Evaluation 09/09/22   mini-reassessment 08/08/22   Authorization Type [employer]    OT Start Time 0735    OT Stop Time 0815    OT Time Calculation (min) 40 min    Activity Tolerance Patient tolerated treatment well    Behavior During Therapy WFL for tasks assessed/performed             Past Medical History:  Diagnosis Date   Hypertension    Past Surgical History:  Procedure Laterality Date   KIDNEY SURGERY Bilateral    Pt. 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   ONSET DATE: 02/28/22  REFERRING DIAG: right RC tear, left radial nerve palsy, left wrist contracture  THERAPY DIAG:  Pain in left wrist  Stiffness of left wrist, not elsewhere classified  Other symptoms and signs involving the nervous system  Rationale for Evaluation and Treatment: Rehabilitation  SUBJECTIVE:   SUBJECTIVE STATEMENT: S: I'm getting used to the pressure of the splint   PERTINENT HISTORY: Pt is a 62 y/o male presenting with right RC tear, left radial nerve palsy, and left wrist contracture. Pt presents in a standard prefabricated wrist splint today, minimal benefit to wrist and left hand. Pt fell several years ago landing on his right shoulder, then had surgery. In March, pt fell at work when tripping over a mat, then later tried to lift a box overhead and sustained a RC tear. Pt was scheduled for a reverse TSA, but has postponed due to the severity of deficits with his left hand and wrist. Pt has had a nerve conduction study completed, pt reports he was told that it would be 6 months before he saw improvement. Currently, the pt's  primary concern is his left hand and wrist, this is where he would like therapy to focus.   PRECAUTIONS: None  WEIGHT BEARING RESTRICTIONS: No  PAIN:  Are you having pain? Yes: NPRS scale: 9/10 Pain location: right shoulder Pain description: aching, sharp, sore Aggravating factors: movement, use, lifting Relieving factors: nothing  FALLS: Has patient fallen in last 6 months? Yes. Number of falls 2  PLOF: Independent  PATIENT GOALS: To get his left hand working again.   NEXT MD VISIT: unsure  OBJECTIVE:   HAND DOMINANCE: Left  ADLs: Overall ADLs: Pt reports he can wash the left side of his body and he can lift a lightweight milk jug with his left hand, otherwise he is unable to use his left hand functionally. He has been soaking his hand in epsom salts and hot water to stretch it.   FUNCTIONAL OUTCOME MEASURES: Quick Dash: 81.82  UPPER EXTREMITY ROM:     Active ROM Left eval  Elbow flexion 130  Elbow extension 0  Wrist flexion 50  Wrist extension 0  Wrist ulnar deviation 0  Wrist radial deviation 0  Wrist pronation 45  Wrist supination 90  (Blank rows = not tested)  Active ROM Left eval  Thumb MCP (0-60) 72  Thumb IP (0-80) 21  Thumb Opposition to Small Finger  0  Index MCP (0-90)  72  Index PIP (  0-100)  62  Index DIP (0-70)  30  Long MCP (0-90)  68  Long PIP (0-100)  62  Long DIP (0-70)  55  Ring MCP (0-90)  58  Ring PIP (0-100)  82  Ring DIP (0-70)  40  Little MCP (0-90)  74  Little PIP (0-100)  58  Little DIP (0-70)  45  (Blank rows = not tested)   UPPER EXTREMITY MMT:     MMT Left eval  Elbow flexion 5/5  Elbow extension 5/5  Wrist flexion 3-/5  Wrist extension 1/5  Wrist ulnar deviation 0/5  Wrist radial deviation 0/5  Wrist pronation 2/5  Wrist supination 3-/5  (Blank rows = not tested)  HAND FUNCTION: Grip strength: Right: 68 lbs; Left: 10 lbs, Lateral pinch: Right: 13 lbs, Left: 6 lbs, and 3 point pinch: Right: 14 lbs, Left: 1  lbs  COORDINATION: Unable to complete due to deficits  SENSATION: Light touch: WFL  EDEMA: None noted  COGNITION: Overall cognitive status: Difficulty to assess due to: lack of sleep, emotional, tangential   TODAY'S TREATMENT:                                                                                                                              DATE:  07/28/22 -P/ROM: wrist flexion/extension, ulnar/radial deviation, supination/pronation, digit composite flexion/extension, x10 -Wrist A/ROM: flexion, extension, ulnar deviation, radial deviation, supination, pronation, x10 -Digit ROM: composite flexion, extension, abduction/adduction, digit opposition -Towel scrunches x15 -Pumping water: hands clasped elbows flexed pushing down to straight, x10 -Table stretch with hand supinated towards the radial side on table and slowly turning body away and holding, 6x10" -Pouring water motion x10  07/21/22 -Weight bearing through elbow, forearm supinated x60" and forearm pronated x60" -Fitted radial palsy splint and educated on schedule and maintenance, as well as doffing and donning x4 -radial nerve tendon glide x10 -Wrist A/ROM: flexion, extension, ulnar deviation, radial deviation, supination, pronation, x10 -Digit ROM: composite flexion, extension, abduction/adduction, digit opposition    PATIENT EDUCATION: Education details: Radial Nerve Tendon Glide Person educated: Patient Education method: Explanation, Demonstration, and Handouts Education comprehension: verbalized understanding and returned demonstration  HOME EXERCISE PROGRAM: Eval: weightbearing 1/5: Radial Nerve Tendon Glide  GOALS: Goals reviewed with patient? Yes  SHORT TERM GOALS: Target date: 08/08/22  Pt will be provided with and educated on HEP to improve mobility in LUE required for use during ADL completion.   Goal status: IN PROGRESS  2.  Pt will be provided with and educated on splint wear and care for  radial nerve palsy, demonstrating independence in donning and doffing.   Goal status: IN PROGRESS  3.  Pt will increase left wrist strength to 3+/5 to improve ability to reach for items using his left hand and maintain a light grasp.   Goal status: IN PROGRESS  4.  Pt will increase left grip strength by 5# and pinch strength by 2# to improve ability to grasp  and maintain hold on tools for ADL completion such as a cup, toothbrush, clothing, etc.   Goal status: IN PROGRESS    LONG TERM GOALS: Target date: 09/09/22  Pt will decrease pain in LUE to 3/10 or less to improve ability to sleep for 2+ consecutive hours without waking due to pain.   Goal status: IN PROGRESS  2.  Pt will decrease LUE fascial restrictions to min amounts or less to improve mobility required for functional reaching tasks.   Goal status: IN PROGRESS  3.  Pt will increase LUE A/ROM by at least 25 degrees to improve ability to use LUE when reaching overhead or behind back during dressing and bathing tasks.   Goal status: IN PROGRESS  4.  Pt will increase LUE strength to 4/5 or greater to improve ability to use LUE when lifting or carrying items during meal preparation/housework/yardwork tasks.   Goal status: IN PROGRESS  5.  Pt will return to highest level of function using LUE as dominant during functional task completion.   Goal status: IN PROGRESS      ASSESSMENT:  CLINICAL IMPRESSION: This session pt working on improving ROM of wrist and hand, as well as working on tendon glides for the radial nerve. When pt was completing wrist A/ROM he reported some pain with flexion and radial deviation, however he stated that he could work through it. Pt was provided a radial nerve splint for his wrist, to limit extension and assist with extending his digits. He required max assist and multiple attempts to don and doff the splint independently. OT providing verbal and tactile cuing for positioning and techniques.     PERFORMANCE DEFICITS: in functional skills including ADLs, IADLs, coordination, dexterity, proprioception, sensation, edema, tone, ROM, strength, pain, fascial restrictions, Fine motor control, endurance, and UE functional use, cognitive skills including attention, emotional, and thought, and psychosocial skills including coping strategies.     PLAN:  OT FREQUENCY: 2x/week  OT DURATION: 8 weeks  PLANNED INTERVENTIONS: self care/ADL training, therapeutic exercise, therapeutic activity, neuromuscular re-education, manual therapy, passive range of motion, splinting, electrical stimulation, paraffin, patient/family education, and DME and/or AE instructions  RECOMMENDED OTHER SERVICES: follow up with MD regarding sleep  CONSULTED AND AGREED WITH PLAN OF CARE: Patient  PLAN FOR NEXT SESSION: Follow up on HEP, check on splint, Begin working on P/ROM of hand and wrist, trial NMES for wrist extension, tendon glides   [facility] [phone] 07/28/2022, 7:35 AM

## 2022-08-04 ENCOUNTER — Encounter (HOSPITAL_COMMUNITY): Payer: Self-pay | Admitting: Occupational Therapy

## 2022-08-04 ENCOUNTER — Ambulatory Visit (HOSPITAL_COMMUNITY): Payer: BLUE CROSS/BLUE SHIELD | Admitting: Occupational Therapy

## 2022-08-04 DIAGNOSIS — M25532 Pain in left wrist: Secondary | ICD-10-CM | POA: Diagnosis not present

## 2022-08-04 DIAGNOSIS — R29818 Other symptoms and signs involving the nervous system: Secondary | ICD-10-CM

## 2022-08-04 DIAGNOSIS — M25632 Stiffness of left wrist, not elsewhere classified: Secondary | ICD-10-CM

## 2022-08-04 NOTE — Therapy (Signed)
OUTPATIENT OCCUPATIONAL THERAPY ORTHO TREATMENT  Patient Name: Dalton Guerrero MRN: [id number] DOB:12-10-1960, 62 y.o., male Today's Date: 08/04/2022  PCP: Dr. Modena Morrow REFERRING PROVIDER: [name], PA-C  END OF SESSION:  OT End of Session - 08/04/22 0732     Visit Number 4    Number of Visits 16    Date for OT Re-Evaluation 09/09/22   mini-reassessment 08/08/22   Authorization Type [employer]    OT Start Time 0730    OT Stop Time 0810    OT Time Calculation (min) 40 min    Activity Tolerance Patient tolerated treatment well    Behavior During Therapy WFL for tasks assessed/performed             Past Medical History:  Diagnosis Date   Hypertension    Past Surgical History:  Procedure Laterality Date   KIDNEY SURGERY Bilateral    Pt. 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   ONSET DATE: 02/28/22  REFERRING DIAG: right RC tear, left radial nerve palsy, left wrist contracture  THERAPY DIAG:  Pain in left wrist  Stiffness of left wrist, not elsewhere classified  Other symptoms and signs involving the nervous system  Rationale for Evaluation and Treatment: Rehabilitation  SUBJECTIVE:   SUBJECTIVE STATEMENT: S: I don't want my hand to die   PERTINENT HISTORY: Pt is a 62 y/o male presenting with right RC tear, left radial nerve palsy, and left wrist contracture. Pt presents in a standard prefabricated wrist splint today, minimal benefit to wrist and left hand. Pt fell several years ago landing on his right shoulder, then had surgery. In March, pt fell at work when tripping over a mat, then later tried to lift a box overhead and sustained a RC tear. Pt was scheduled for a reverse TSA, but has postponed due to the severity of deficits with his left hand and wrist. Pt has had a nerve conduction study completed, pt reports he was told that it would be 6 months before he saw improvement. Currently, the pt's primary concern is  his left hand and wrist, this is where he would like therapy to focus.   PRECAUTIONS: None  WEIGHT BEARING RESTRICTIONS: No  PAIN:  Are you having pain? Yes: NPRS scale: 9/10 Pain location: right shoulder Pain description: aching, sharp, sore Aggravating factors: movement, use, lifting Relieving factors: nothing  FALLS: Has patient fallen in last 6 months? Yes. Number of falls 2  PLOF: Independent  PATIENT GOALS: To get his left hand working again.   NEXT MD VISIT: unsure  OBJECTIVE:   HAND DOMINANCE: Left  ADLs: Overall ADLs: Pt reports he can wash the left side of his body and he can lift a lightweight milk jug with his left hand, otherwise he is unable to use his left hand functionally. He has been soaking his hand in epsom salts and hot water to stretch it.   FUNCTIONAL OUTCOME MEASURES: Quick Dash: 81.82  UPPER EXTREMITY ROM:     Active ROM Left eval  Elbow flexion 130  Elbow extension 0  Wrist flexion 50  Wrist extension 0  Wrist ulnar deviation 0  Wrist radial deviation 0  Wrist pronation 45  Wrist supination 90  (Blank rows = not tested)  Active ROM Left eval  Thumb MCP (0-60) 72  Thumb IP (0-80) 21  Thumb Opposition to Small Finger  0  Index MCP (0-90)  72  Index PIP (0-100)  62  Index DIP (0-70)  30  Long MCP (0-90)  68  Long PIP (0-100)  62  Long DIP (0-70)  55  Ring MCP (0-90)  58  Ring PIP (0-100)  82  Ring DIP (0-70)  40  Little MCP (0-90)  74  Little PIP (0-100)  58  Little DIP (0-70)  45  (Blank rows = not tested)   UPPER EXTREMITY MMT:     MMT Left eval  Elbow flexion 5/5  Elbow extension 5/5  Wrist flexion 3-/5  Wrist extension 1/5  Wrist ulnar deviation 0/5  Wrist radial deviation 0/5  Wrist pronation 2/5  Wrist supination 3-/5  (Blank rows = not tested)  HAND FUNCTION: Grip strength: Right: 68 lbs; Left: 10 lbs, Lateral pinch: Right: 13 lbs, Left: 6 lbs, and 3 point pinch: Right: 14 lbs, Left: 1  lbs  COORDINATION: Unable to complete due to deficits  SENSATION: Light touch: WFL  EDEMA: None noted  COGNITION: Overall cognitive status: Difficulty to assess due to: lack of sleep, emotional, tangential   TODAY'S TREATMENT:                                                                                                                              DATE:  08/04/22 -P/ROM: wrist flexion/extension, ulnar/radial deviation, supination/pronation, digit composite flexion/extension, x10 -Wrist A/ROM: flexion, extension, ulnar deviation, radial deviation, supination, pronation, x10 -Digit ROM: composite flexion, extension, abduction/adduction, digit opposition -NMES: [place] setting, 10/10 schedule, up to 35 mA CC  07/28/22 -P/ROM: wrist flexion/extension, ulnar/radial deviation, supination/pronation, digit composite flexion/extension, x10 -Wrist A/ROM: flexion, extension, ulnar deviation, radial deviation, supination, pronation, x10 -Digit ROM: composite flexion, extension, abduction/adduction, digit opposition -Towel scrunches x15 -Pumping water: hands clasped elbows flexed pushing down to straight, x10 -Table stretch with hand supinated towards the radial side on table and slowly turning body away and holding, 6x10" -Pouring water motion x10  07/21/22 -Weight bearing through elbow, forearm supinated x60" and forearm pronated x60" -Fitted radial palsy splint and educated on schedule and maintenance, as well as doffing and donning x4 -radial nerve tendon glide x10 -Wrist A/ROM: flexion, extension, ulnar deviation, radial deviation, supination, pronation, x10 -Digit ROM: composite flexion, extension, abduction/adduction, digit opposition    PATIENT EDUCATION: Education details: Digit ROM Person educated: Patient Education method: Explanation, Demonstration, and Handouts Education comprehension: verbalized understanding and returned demonstration  HOME EXERCISE PROGRAM: Eval:  weightbearing 1/5: Radial Nerve Tendon Glide 1/19: Digit ROM  GOALS: Goals reviewed with patient? Yes  SHORT TERM GOALS: Target date: 08/08/22  Pt will be provided with and educated on HEP to improve mobility in LUE required for use during ADL completion.   Goal status: IN PROGRESS  2.  Pt will be provided with and educated on splint wear and care for radial nerve palsy, demonstrating independence in donning and doffing.   Goal status: IN PROGRESS  3.  Pt will increase left wrist strength to 3+/5 to improve ability  to reach for items using his left hand and maintain a light grasp.   Goal status: IN PROGRESS  4.  Pt will increase left grip strength by 5# and pinch strength by 2# to improve ability to grasp and maintain hold on tools for ADL completion such as a cup, toothbrush, clothing, etc.   Goal status: IN PROGRESS    LONG TERM GOALS: Target date: 09/09/22  Pt will decrease pain in LUE to 3/10 or less to improve ability to sleep for 2+ consecutive hours without waking due to pain.   Goal status: IN PROGRESS  2.  Pt will decrease LUE fascial restrictions to min amounts or less to improve mobility required for functional reaching tasks.   Goal status: IN PROGRESS  3.  Pt will increase LUE A/ROM by at least 25 degrees to improve ability to use LUE when reaching overhead or behind back during dressing and bathing tasks.   Goal status: IN PROGRESS  4.  Pt will increase LUE strength to 4/5 or greater to improve ability to use LUE when lifting or carrying items during meal preparation/housework/yardwork tasks.   Goal status: IN PROGRESS  5.  Pt will return to highest level of function using LUE as dominant during functional task completion.   Goal status: IN PROGRESS      ASSESSMENT:  CLINICAL IMPRESSION: Started NMES this session for wrist extension. He continues to have limited ROM of his wrist in extension despite the NMES, as well as some pain in the flexor  aspect of his forearm towards the end of the session. All digits on the L hand continue to be stiff and having difficulties even with P/ROM and stretching to fully extend his digits. OT Providing maximal verbal and tactile cuing for positioning and technique, as well as for attention to tasks throughout session.   PERFORMANCE DEFICITS: in functional skills including ADLs, IADLs, coordination, dexterity, proprioception, sensation, edema, tone, ROM, strength, pain, fascial restrictions, Fine motor control, endurance, and UE functional use, cognitive skills including attention, emotional, and thought, and psychosocial skills including coping strategies.     PLAN:  OT FREQUENCY: 2x/week  OT DURATION: 8 weeks  PLANNED INTERVENTIONS: self care/ADL training, therapeutic exercise, therapeutic activity, neuromuscular re-education, manual therapy, passive range of motion, splinting, electrical stimulation, paraffin, patient/family education, and DME and/or AE instructions  RECOMMENDED OTHER SERVICES: follow up with MD regarding sleep  CONSULTED AND AGREED WITH PLAN OF CARE: Patient  PLAN FOR NEXT SESSION: Follow up on HEP, P/ROM of hand and wrist, trial NMES for wrist extension, tendon glides, gentle A/ROM   Paulita Fujita, OTR/L [facility] [phone] 08/04/2022, 7:59 AM

## 2022-08-09 ENCOUNTER — Encounter (HOSPITAL_COMMUNITY): Payer: Self-pay | Admitting: Occupational Therapy

## 2022-08-09 ENCOUNTER — Ambulatory Visit (HOSPITAL_COMMUNITY): Payer: BLUE CROSS/BLUE SHIELD | Admitting: Occupational Therapy

## 2022-08-09 DIAGNOSIS — M25532 Pain in left wrist: Secondary | ICD-10-CM | POA: Diagnosis not present

## 2022-08-09 DIAGNOSIS — M25632 Stiffness of left wrist, not elsewhere classified: Secondary | ICD-10-CM

## 2022-08-09 DIAGNOSIS — R29818 Other symptoms and signs involving the nervous system: Secondary | ICD-10-CM

## 2022-08-09 NOTE — Therapy (Signed)
OUTPATIENT OCCUPATIONAL THERAPY ORTHO TREATMENT  Patient Name: Dalton Guerrero MRN: [id number] DOB:February 18, 1961, 62 y.o., male Today's Date: 08/09/2022  PCP: Dr. Modena Morrow REFERRING PROVIDER: [name], PA-C  END OF SESSION:  OT End of Session - 08/09/22 0856     Visit Number 5    Number of Visits 16    Date for OT Re-Evaluation 09/09/22   mini-reassessment 08/08/22   Authorization Type [employer]    OT Start Time 0900    OT Stop Time 0940    OT Time Calculation (min) 40 min    Activity Tolerance Patient tolerated treatment well    Behavior During Therapy WFL for tasks assessed/performed             Past Medical History:  Diagnosis Date   Hypertension    Past Surgical History:  Procedure Laterality Date   KIDNEY SURGERY Bilateral    Pt. 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   ONSET DATE: 02/28/22  REFERRING DIAG: right RC tear, left radial nerve palsy, left wrist contracture  THERAPY DIAG:  Pain in left wrist  Stiffness of left wrist, not elsewhere classified  Other symptoms and signs involving the nervous system  Rationale for Evaluation and Treatment: Rehabilitation  SUBJECTIVE:   SUBJECTIVE STATEMENT: S: "I got too much to do, I need my hand to work."   PERTINENT HISTORY: Pt is a 62 y/o male presenting with right RC tear, left radial nerve palsy, and left wrist contracture. Pt presents in a standard prefabricated wrist splint today, minimal benefit to wrist and left hand. Pt fell several years ago landing on his right shoulder, then had surgery. In March, pt fell at work when tripping over a mat, then later tried to lift a box overhead and sustained a RC tear. Pt was scheduled for a reverse TSA, but has postponed due to the severity of deficits with his left hand and wrist. Pt has had a nerve conduction study completed, pt reports he was told that it would be 6 months before he saw improvement. Currently, the  pt's primary concern is his left hand and wrist, this is where he would like therapy to focus.   PRECAUTIONS: None  WEIGHT BEARING RESTRICTIONS: No  PAIN:  Are you having pain? Yes: NPRS scale: 5/10 Pain location: right shoulder Pain description: aching, sharp, sore Aggravating factors: movement, use, lifting Relieving factors: nothing  FALLS: Has patient fallen in last 6 months? Yes. Number of falls 2  PLOF: Independent  PATIENT GOALS: To get his left hand working again.   NEXT MD VISIT: unsure  OBJECTIVE:   HAND DOMINANCE: Left  ADLs: Overall ADLs: Pt reports he can wash the left side of his body and he can lift a lightweight milk jug with his left hand, otherwise he is unable to use his left hand functionally. He has been soaking his hand in epsom salts and hot water to stretch it.   FUNCTIONAL OUTCOME MEASURES: Quick Dash: 81.82  UPPER EXTREMITY ROM:     Active ROM Left eval  Elbow flexion 130  Elbow extension 0  Wrist flexion 50  Wrist extension 0  Wrist ulnar deviation 0  Wrist radial deviation 0  Wrist pronation 45  Wrist supination 90  (Blank rows = not tested)  Active ROM Left eval  Thumb MCP (0-60) 72  Thumb IP (0-80) 21  Thumb Opposition to Small Finger  0  Index MCP (0-90)  72  Index PIP (0-100)  62  Index DIP (0-70)  30  Long MCP (0-90)  68  Long PIP (0-100)  62  Long DIP (0-70)  55  Ring MCP (0-90)  58  Ring PIP (0-100)  82  Ring DIP (0-70)  40  Little MCP (0-90)  74  Little PIP (0-100)  58  Little DIP (0-70)  45  (Blank rows = not tested)   UPPER EXTREMITY MMT:     MMT Left eval  Elbow flexion 5/5  Elbow extension 5/5  Wrist flexion 3-/5  Wrist extension 1/5  Wrist ulnar deviation 0/5  Wrist radial deviation 0/5  Wrist pronation 2/5  Wrist supination 3-/5  (Blank rows = not tested)  HAND FUNCTION: Grip strength: Right: 68 lbs; Left: 10 lbs, Lateral pinch: Right: 13 lbs, Left: 6 lbs, and 3 point pinch: Right: 14 lbs, Left:  1 lbs  COORDINATION: Unable to complete due to deficits  SENSATION: Light touch: WFL  EDEMA: None noted  COGNITION: Overall cognitive status: Difficulty to assess due to: lack of sleep, emotional, tangential   TODAY'S TREATMENT:                                                                                                                              DATE:   08/09/22 -P/ROM: wrist flexion/extension, ulnar/radial deviation, supination/pronation, digit composite flexion/extension, x10 -Wrist A/ROM: flexion, extension, ulnar deviation, radial deviation, supination, pronation, x10 -Digit ROM: composite flexion, extension, abduction/adduction, digit opposition -Weight bearing: palms flat on table, wrists extended, and weight shifting 2x45" - Weight bearing through elbows with palms flat on table and elbows flexed 2x60" -NMES: [place] setting, 10/10 pattern, up to 35 mA CC, assisting L wrist into extension while providing stimulation, pt actively going into wrist flexion and making a fist when no simulation is provided.  -LUE Tendon glides x10 - modified due to pt limited ROM, OT providing mod assist to achieve proper positions.   08/04/22 -P/ROM: wrist flexion/extension, ulnar/radial deviation, supination/pronation, digit composite flexion/extension, x10 -Wrist A/ROM: flexion, extension, ulnar deviation, radial deviation, supination, pronation, x10 -Digit ROM: composite flexion, extension, abduction/adduction, digit opposition -NMES: [place] setting, 10/10 schedule, up to 35 mA CC  07/28/22 -P/ROM: wrist flexion/extension, ulnar/radial deviation, supination/pronation, digit composite flexion/extension, x10 -Wrist A/ROM: flexion, extension, ulnar deviation, radial deviation, supination, pronation, x10 -Digit ROM: composite flexion, extension, abduction/adduction, digit opposition -Towel scrunches x15 -Pumping water: hands clasped elbows flexed pushing down to straight, x10 -Table stretch  with hand supinated towards the radial side on table and slowly turning body away and holding, 6x10" -Pouring water motion x10    PATIENT EDUCATION: Education details: Reviewed HEP Person educated: Patient Education method: Consulting civil engineer, Demonstration, and Handouts Education comprehension: verbalized understanding and returned demonstration  HOME EXERCISE PROGRAM: Eval: weightbearing 1/5: Radial Nerve Tendon Glide 1/19: Digit ROM  GOALS: Goals reviewed with patient? Yes  SHORT TERM GOALS: Target date: 08/08/22  Pt will be provided with and educated on HEP  to improve mobility in LUE required for use during ADL completion.   Goal status: IN PROGRESS  2.  Pt will be provided with and educated on splint wear and care for radial nerve palsy, demonstrating independence in donning and doffing.   Goal status: IN PROGRESS  3.  Pt will increase left wrist strength to 3+/5 to improve ability to reach for items using his left hand and maintain a light grasp.   Goal status: IN PROGRESS  4.  Pt will increase left grip strength by 5# and pinch strength by 2# to improve ability to grasp and maintain hold on tools for ADL completion such as a cup, toothbrush, clothing, etc.   Goal status: IN PROGRESS    LONG TERM GOALS: Target date: 09/09/22  Pt will decrease pain in LUE to 3/10 or less to improve ability to sleep for 2+ consecutive hours without waking due to pain.   Goal status: IN PROGRESS  2.  Pt will decrease LUE fascial restrictions to min amounts or less to improve mobility required for functional reaching tasks.   Goal status: IN PROGRESS  3.  Pt will increase LUE A/ROM by at least 25 degrees to improve ability to use LUE when reaching overhead or behind back during dressing and bathing tasks.   Goal status: IN PROGRESS  4.  Pt will increase LUE strength to 4/5 or greater to improve ability to use LUE when lifting or carrying items during meal preparation/housework/yardwork  tasks.   Goal status: IN PROGRESS  5.  Pt will return to highest level of function using LUE as dominant during functional task completion.   Goal status: IN PROGRESS      ASSESSMENT:  CLINICAL IMPRESSION: Pt continuing to work on ROM with his wrist and digits of the LUE. He continues to have severely limited flexion and extension of all digits due to stiffness in the MCP, PIP, and DIP joints, as well as severely limited extension of the wrist. Continued with NMES this session and pt was able to achieve slight extension past 0 degrees in his wrist this session, as well as further digit flexion when making a fist after wrist extension. Pt attempted tendon glides this session, however due to stiffness and poor ROM, he was unable to achieve good form for the tendon glides to be effective. OT Providing maximal verbal and tactile cuing for positioning and technique, as well as for attention to tasks throughout session.   PERFORMANCE DEFICITS: in functional skills including ADLs, IADLs, coordination, dexterity, proprioception, sensation, edema, tone, ROM, strength, pain, fascial restrictions, Fine motor control, endurance, and UE functional use, cognitive skills including attention, emotional, and thought, and psychosocial skills including coping strategies.     PLAN:  OT FREQUENCY: 2x/week  OT DURATION: 8 weeks  PLANNED INTERVENTIONS: self care/ADL training, therapeutic exercise, therapeutic activity, neuromuscular re-education, manual therapy, passive range of motion, splinting, electrical stimulation, paraffin, patient/family education, and DME and/or AE instructions  RECOMMENDED OTHER SERVICES: follow up with MD regarding sleep  CONSULTED AND AGREED WITH PLAN OF CARE: Patient  PLAN FOR NEXT SESSION: Follow up on HEP, P/ROM of hand and wrist, NMES for wrist extension, tendon glides, gentle A/ROM, start pinch strengthening and grip strengthening   Ellender Hose [facility]  Outpatient Rehab [phone] 08/09/2022, 12:19 PM

## 2022-08-15 ENCOUNTER — Ambulatory Visit (HOSPITAL_COMMUNITY): Payer: BLUE CROSS/BLUE SHIELD | Admitting: Occupational Therapy

## 2022-08-15 ENCOUNTER — Encounter (HOSPITAL_COMMUNITY): Payer: Self-pay | Admitting: Occupational Therapy

## 2022-08-15 DIAGNOSIS — R29818 Other symptoms and signs involving the nervous system: Secondary | ICD-10-CM

## 2022-08-15 DIAGNOSIS — M25632 Stiffness of left wrist, not elsewhere classified: Secondary | ICD-10-CM

## 2022-08-15 DIAGNOSIS — M25532 Pain in left wrist: Secondary | ICD-10-CM

## 2022-08-15 NOTE — Therapy (Unsigned)
OUTPATIENT OCCUPATIONAL THERAPY ORTHO TREATMENT  Patient Name: Dalton Guerrero MRN: [id number] DOB:07-21-1960, 62 y.o., male Today's Date: 08/09/2022  PCP: Dr. Modena Morrow REFERRING PROVIDER: [name], PA-C  END OF SESSION:  OT End of Session - 08/09/22 0856     Visit Number 5    Number of Visits 16    Date for OT Re-Evaluation 09/09/22   mini-reassessment 08/08/22   Authorization Type [employer]    OT Start Time 0900    OT Stop Time 0940    OT Time Calculation (min) 40 min    Activity Tolerance Patient tolerated treatment well    Behavior During Therapy WFL for tasks assessed/performed             Past Medical History:  Diagnosis Date   Hypertension    Past Surgical History:  Procedure Laterality Date   KIDNEY SURGERY Bilateral    Pt. 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   ONSET DATE: 02/28/22  REFERRING DIAG: right RC tear, left radial nerve palsy, left wrist contracture  THERAPY DIAG:  Pain in left wrist  Stiffness of left wrist, not elsewhere classified  Other symptoms and signs involving the nervous system  Rationale for Evaluation and Treatment: Rehabilitation  SUBJECTIVE:   SUBJECTIVE STATEMENT: S: "My shoulder is really bothering me today"   PERTINENT HISTORY: Pt is a 62 y/o male presenting with right RC tear, left radial nerve palsy, and left wrist contracture. Pt presents in a standard prefabricated wrist splint today, minimal benefit to wrist and left hand. Pt fell several years ago landing on his right shoulder, then had surgery. In March, pt fell at work when tripping over a mat, then later tried to lift a box overhead and sustained a RC tear. Pt was scheduled for a reverse TSA, but has postponed due to the severity of deficits with his left hand and wrist. Pt has had a nerve conduction study completed, pt reports he was told that it would be 6 months before he saw improvement. Currently, the pt's  primary concern is his left hand and wrist, this is where he would like therapy to focus.   PRECAUTIONS: None  WEIGHT BEARING RESTRICTIONS: No  PAIN:  Are you having pain? Yes: NPRS scale: 5/10 Pain location: right shoulder Pain description: aching, sharp, sore Aggravating factors: movement, use, lifting Relieving factors: nothing  FALLS: Has patient fallen in last 6 months? Yes. Number of falls 2  PLOF: Independent  PATIENT GOALS: To get his left hand working again.   NEXT MD VISIT: unsure  OBJECTIVE:   HAND DOMINANCE: Left  ADLs: Overall ADLs: Pt reports he can wash the left side of his body and he can lift a lightweight milk jug with his left hand, otherwise he is unable to use his left hand functionally. He has been soaking his hand in epsom salts and hot water to stretch it.   FUNCTIONAL OUTCOME MEASURES: Quick Dash: 81.82  UPPER EXTREMITY ROM:     Active ROM Left eval  Elbow flexion 130  Elbow extension 0  Wrist flexion 50  Wrist extension 0  Wrist ulnar deviation 0  Wrist radial deviation 0  Wrist pronation 45  Wrist supination 90  (Blank rows = not tested)  Active ROM Left eval  Thumb MCP (0-60) 72  Thumb IP (0-80) 21  Thumb Opposition to Small Finger  0  Index MCP (0-90)  72  Index PIP (0-100)  62  Index DIP (0-70)  30  Long MCP (0-90)  68  Long PIP (0-100)  62  Long DIP (0-70)  55  Ring MCP (0-90)  58  Ring PIP (0-100)  82  Ring DIP (0-70)  40  Little MCP (0-90)  74  Little PIP (0-100)  58  Little DIP (0-70)  45  (Blank rows = not tested)   UPPER EXTREMITY MMT:     MMT Left eval  Elbow flexion 5/5  Elbow extension 5/5  Wrist flexion 3-/5  Wrist extension 1/5  Wrist ulnar deviation 0/5  Wrist radial deviation 0/5  Wrist pronation 2/5  Wrist supination 3-/5  (Blank rows = not tested)  HAND FUNCTION: Grip strength: Right: 68 lbs; Left: 10 lbs, Lateral pinch: Right: 13 lbs, Left: 6 lbs, and 3 point pinch: Right: 14 lbs, Left: 1  lbs  COORDINATION: Unable to complete due to deficits  SENSATION: Light touch: WFL  EDEMA: None noted  COGNITION: Overall cognitive status: Difficulty to assess due to: lack of sleep, emotional, tangential   TODAY'S TREATMENT:                                                                                                                              DATE:   08/15/22 -Manual Therapy: myofascial release and trigger point, applied to L forearm flexors and extensors to address fascial restrictions and pain in order to improve ROM. -P/ROM: wrist flexion/extension, ulnar/radial deviation, supination/pronation, digit composite flexion/extension, x10 -Wrist A/ROM: flexion, extension, ulnar deviation, radial deviation, supination, pronation, x10 -Digit ROM: composite flexion abduction/adduction, digit opposition, x10 each -Digiflex: 3lbs, full squeeze x10, each digit flexing x5 -Gripper: 7lbs and 11lb squeeze x 10 each -Weighted stretches: 2lb dumbbell flexion and extension -Pinch strengthening: yellow and red resistance clips, tripod and lateral pinch, x10 each  08/09/22 -P/ROM: wrist flexion/extension, ulnar/radial deviation, supination/pronation, digit composite flexion/extension, x10 -Wrist A/ROM: flexion, extension, ulnar deviation, radial deviation, supination, pronation, x10 -Digit ROM: composite flexion, extension, abduction/adduction, digit opposition -Weight bearing: palms flat on table, wrists extended, and weight shifting 2x45" - Weight bearing through elbows with palms flat on table and elbows flexed 2x60" -NMES: [place] setting, 10/10 pattern, up to 35 mA CC, assisting L wrist into extension while providing stimulation, pt actively going into wrist flexion and making a fist when no simulation is provided.  -LUE Tendon glides x10 - modified due to pt limited ROM, OT providing mod assist to achieve proper positions.   08/04/22 -P/ROM: wrist flexion/extension, ulnar/radial  deviation, supination/pronation, digit composite flexion/extension, x10 -Wrist A/ROM: flexion, extension, ulnar deviation, radial deviation, supination, pronation, x10 -Digit ROM: composite flexion, extension, abduction/adduction, digit opposition -NMES: [place] setting, 10/10 schedule, up to 35 mA CC     PATIENT EDUCATION: Education details: weighted Designer, fashion/clothing Person educated: Patient Education method: Consulting civil engineer, Demonstration, and Handouts Education comprehension: verbalized understanding and returned demonstration  HOME EXERCISE PROGRAM: Eval: weightbearing 1/5: Radial Nerve Tendon Glide 1/19: Digit ROM  1/30: weighted wrist stretches  GOALS: Goals reviewed with patient? Yes  SHORT TERM GOALS: Target date: 08/08/22  Pt will be provided with and educated on HEP to improve mobility in LUE required for use during ADL completion.   Goal status: IN PROGRESS  2.  Pt will be provided with and educated on splint wear and care for radial nerve palsy, demonstrating independence in donning and doffing.   Goal status: IN PROGRESS  3.  Pt will increase left wrist strength to 3+/5 to improve ability to reach for items using his left hand and maintain a light grasp.   Goal status: IN PROGRESS  4.  Pt will increase left grip strength by 5# and pinch strength by 2# to improve ability to grasp and maintain hold on tools for ADL completion such as a cup, toothbrush, clothing, etc.   Goal status: IN PROGRESS    LONG TERM GOALS: Target date: 09/09/22  Pt will decrease pain in LUE to 3/10 or less to improve ability to sleep for 2+ consecutive hours without waking due to pain.   Goal status: IN PROGRESS  2.  Pt will decrease LUE fascial restrictions to min amounts or less to improve mobility required for functional reaching tasks.   Goal status: IN PROGRESS  3.  Pt will increase LUE A/ROM by at least 25 degrees to improve ability to use LUE when reaching overhead or behind  back during dressing and bathing tasks.   Goal status: IN PROGRESS  4.  Pt will increase LUE strength to 4/5 or greater to improve ability to use LUE when lifting or carrying items during meal preparation/housework/yardwork tasks.   Goal status: IN PROGRESS  5.  Pt will return to highest level of function using LUE as dominant during functional task completion.   Goal status: IN PROGRESS      ASSESSMENT:  CLINICAL IMPRESSION: This session pt worked on stretching his forearms and wrists, as well as improving his grip and pinch strength. He continues to have stiff MCP, PIP, and DIP joints limiting his ROM, as well as severe fascial restrictions noted along the flexor bundle in his forearm, which was addressed with manual therapy this session. Pt continues to have weak wrist extension and his wrist remains in flexion at rest. When completing gripping tasks, he is unable to keep his wrist in neutral and pulls into flexion despite OT providing mod-max resistance in attempt to keep the wrist in neutral. OT providing maximal verbal, tactile, and visual cuing throughout session for positioning, sequencing, and technique, as pt has difficulty following directions for new tasks without multiple repetitions and assist.   PERFORMANCE DEFICITS: in functional skills including ADLs, IADLs, coordination, dexterity, proprioception, sensation, edema, tone, ROM, strength, pain, fascial restrictions, Fine motor control, endurance, and UE functional use, cognitive skills including attention, emotional, and thought, and psychosocial skills including coping strategies.     PLAN:  OT FREQUENCY: 2x/week  OT DURATION: 8 weeks  PLANNED INTERVENTIONS: self care/ADL training, therapeutic exercise, therapeutic activity, neuromuscular re-education, manual therapy, passive range of motion, splinting, electrical stimulation, paraffin, patient/family education, and DME and/or AE instructions  RECOMMENDED OTHER  SERVICES: follow up with MD regarding sleep  CONSULTED AND AGREED WITH PLAN OF CARE: Patient  PLAN FOR NEXT SESSION: Follow up on HEP, P/ROM of hand and wrist, NMES for wrist extension, tendon glides, gentle A/ROM, pinch strengthening and grip strengthening   Dallie Dad [facility] [phone] 08/09/2022, 12:19 PM

## 2022-08-17 ENCOUNTER — Encounter (HOSPITAL_COMMUNITY): Payer: Self-pay | Admitting: Occupational Therapy

## 2022-08-17 ENCOUNTER — Ambulatory Visit (HOSPITAL_COMMUNITY): Payer: BLUE CROSS/BLUE SHIELD | Attending: Family Medicine | Admitting: Occupational Therapy

## 2022-08-17 DIAGNOSIS — R29818 Other symptoms and signs involving the nervous system: Secondary | ICD-10-CM | POA: Diagnosis present

## 2022-08-17 DIAGNOSIS — R29898 Other symptoms and signs involving the musculoskeletal system: Secondary | ICD-10-CM | POA: Diagnosis present

## 2022-08-17 DIAGNOSIS — M25532 Pain in left wrist: Secondary | ICD-10-CM | POA: Insufficient documentation

## 2022-08-17 DIAGNOSIS — M25632 Stiffness of left wrist, not elsewhere classified: Secondary | ICD-10-CM | POA: Insufficient documentation

## 2022-08-17 NOTE — Patient Instructions (Signed)
Home Exercises Program Theraputty Exercises  Do the following exercises 2-3 times a day using your affected hand.  1. Roll putty into a ball.  2. Make into a pancake.  3. Roll putty into a roll.  4. Pinch along log with first finger and thumb.   5. Make into a ball.  6. Roll it back into a log.   7. Pinch using thumb and side of first finger.  8. Roll into a ball, then flatten into a pancake.  9. Using your fingers, make putty into a mountain.  10. Roll putty back into a ball and squeeze gently for 2-3 minutes.   

## 2022-08-17 NOTE — Therapy (Signed)
OUTPATIENT OCCUPATIONAL THERAPY ORTHO TREATMENT  Patient Name: Dalton Guerrero MRN: [id number] DOB:Nov 28, 1960, 62 y.o., male Today's Date: 08/17/2022  PCP: Dr. Modena Morrow REFERRING PROVIDER: [name], PA-C  END OF SESSION:  OT End of Session - 08/17/22 0947     Visit Number 7    Number of Visits 16    Date for OT Re-Evaluation 09/09/22   mini-reassessment 08/08/22   Authorization Type [employer]    OT Start Time 0950    OT Stop Time 1030    OT Time Calculation (min) 40 min    Activity Tolerance Patient tolerated treatment well    Behavior During Therapy WFL for tasks assessed/performed             Past Medical History:  Diagnosis Date   Hypertension    Past Surgical History:  Procedure Laterality Date   KIDNEY SURGERY Bilateral    Pt. 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   ONSET DATE: 02/28/22  REFERRING DIAG: right RC tear, left radial nerve palsy, left wrist contracture  THERAPY DIAG:  Pain in left wrist  Stiffness of left wrist, not elsewhere classified  Other symptoms and signs involving the nervous system  Rationale for Evaluation and Treatment: Rehabilitation  SUBJECTIVE:   SUBJECTIVE STATEMENT: S: "My shoulder is really bothering me today"   PERTINENT HISTORY: Pt is a 62 y/o male presenting with right RC tear, left radial nerve palsy, and left wrist contracture. Pt presents in a standard prefabricated wrist splint today, minimal benefit to wrist and left hand. Pt fell several years ago landing on his right shoulder, then had surgery. In March, pt fell at work when tripping over a mat, then later tried to lift a box overhead and sustained a RC tear. Pt was scheduled for a reverse TSA, but has postponed due to the severity of deficits with his left hand and wrist. Pt has had a nerve conduction study completed, pt reports he was told that it would be 6 months before he saw improvement. Currently, the pt's  primary concern is his left hand and wrist, this is where he would like therapy to focus.   PRECAUTIONS: None  WEIGHT BEARING RESTRICTIONS: No  PAIN:  Are you having pain? Yes: NPRS scale: 6/10 Pain location: right shoulder Pain description: aching, sharp, sore Aggravating factors: movement, use, lifting Relieving factors: nothing  FALLS: Has patient fallen in last 6 months? Yes. Number of falls 2  PLOF: Independent  PATIENT GOALS: To get his left hand working again.   NEXT MD VISIT: unsure  OBJECTIVE:   HAND DOMINANCE: Left  ADLs: Overall ADLs: Pt reports he can wash the left side of his body and he can lift a lightweight milk jug with his left hand, otherwise he is unable to use his left hand functionally. He has been soaking his hand in epsom salts and hot water to stretch it.   FUNCTIONAL OUTCOME MEASURES: Quick Dash: 81.82  UPPER EXTREMITY ROM:     Active ROM Left eval  Elbow flexion 130  Elbow extension 0  Wrist flexion 50  Wrist extension 0  Wrist ulnar deviation 0  Wrist radial deviation 0  Wrist pronation 45  Wrist supination 90  (Blank rows = not tested)  Active ROM Left eval  Thumb MCP (0-60) 72  Thumb IP (0-80) 21  Thumb Opposition to Small Finger  0  Index MCP (0-90)  72  Index PIP (0-100)  62  Index DIP (0-70)  30  Long MCP (0-90)  68  Long PIP (0-100)  62  Long DIP (0-70)  55  Ring MCP (0-90)  58  Ring PIP (0-100)  82  Ring DIP (0-70)  40  Little MCP (0-90)  74  Little PIP (0-100)  58  Little DIP (0-70)  45  (Blank rows = not tested)   UPPER EXTREMITY MMT:     MMT Left eval  Elbow flexion 5/5  Elbow extension 5/5  Wrist flexion 3-/5  Wrist extension 1/5  Wrist ulnar deviation 0/5  Wrist radial deviation 0/5  Wrist pronation 2/5  Wrist supination 3-/5  (Blank rows = not tested)  HAND FUNCTION: Grip strength: Right: 68 lbs; Left: 10 lbs, Lateral pinch: Right: 13 lbs, Left: 6 lbs, and 3 point pinch: Right: 14 lbs, Left: 1  lbs  COORDINATION: Unable to complete due to deficits  SENSATION: Light touch: WFL  EDEMA: None noted  COGNITION: Overall cognitive status: Difficulty to assess due to: lack of sleep, emotional, tangential   TODAY'S TREATMENT:                                                                                                                              DATE:   08/17/22 -P/ROM: wrist flexion/extension, ulnar/radial deviation, supination/pronation, digit composite flexion/extension, x10 -Wrist A/ROM: flexion, extension, ulnar deviation, radial deviation, supination, pronation, x10 -Digit ROM: composite flexion abduction/adduction, digit opposition, x10 each -Theraputty: yellow, pulling apart, roll into a ball, flatten into pancake, use pvc pipe to press out cookies x30, roll into a ball, squeeze x10 -Weighted stretches: 2lb dumbbell flexion and extension  08/15/22 -Manual Therapy: myofascial release and trigger point, applied to L forearm flexors and extensors to address fascial restrictions and pain in order to improve ROM. -P/ROM: wrist flexion/extension, ulnar/radial deviation, supination/pronation, digit composite flexion/extension, x10 -Wrist A/ROM: flexion, extension, ulnar deviation, radial deviation, supination, pronation, x10 -Digit ROM: composite flexion abduction/adduction, digit opposition, x10 each -Digiflex: 3lbs, full squeeze x10, each digit flexing x5 -Gripper: 7lbs and 11lb squeeze x 10 each -Weighted stretches: 2lb dumbbell flexion and extension -Pinch strengthening: yellow and red resistance clips, tripod and lateral pinch, x10 each  08/09/22 -P/ROM: wrist flexion/extension, ulnar/radial deviation, supination/pronation, digit composite flexion/extension, x10 -Wrist A/ROM: flexion, extension, ulnar deviation, radial deviation, supination, pronation, x10 -Digit ROM: composite flexion, extension, abduction/adduction, digit opposition -Weight bearing: palms flat on table,  wrists extended, and weight shifting 2x45" - Weight bearing through elbows with palms flat on table and elbows flexed 2x60" -NMES: [place] setting, 10/10 pattern, up to 35 mA CC, assisting L wrist into extension while providing stimulation, pt actively going into wrist flexion and making a fist when no simulation is provided.  -LUE Tendon glides x10 - modified due to pt limited ROM, OT providing mod assist to achieve proper positions.      PATIENT EDUCATION: Education details: Reviewed HEP Person educated: Patient Education method: Explanation, Demonstration, and  Handouts Education comprehension: verbalized understanding and returned demonstration  HOME EXERCISE PROGRAM: Eval: weightbearing 1/5: Radial Nerve Tendon Glide 1/19: Digit ROM 1/30: weighted wrist stretches  GOALS: Goals reviewed with patient? Yes  SHORT TERM GOALS: Target date: 08/08/22  Pt will be provided with and educated on HEP to improve mobility in LUE required for use during ADL completion.   Goal status: IN PROGRESS  2.  Pt will be provided with and educated on splint wear and care for radial nerve palsy, demonstrating independence in donning and doffing.   Goal status: IN PROGRESS  3.  Pt will increase left wrist strength to 3+/5 to improve ability to reach for items using his left hand and maintain a light grasp.   Goal status: IN PROGRESS  4.  Pt will increase left grip strength by 5# and pinch strength by 2# to improve ability to grasp and maintain hold on tools for ADL completion such as a cup, toothbrush, clothing, etc.   Goal status: IN PROGRESS    LONG TERM GOALS: Target date: 09/09/22  Pt will decrease pain in LUE to 3/10 or less to improve ability to sleep for 2+ consecutive hours without waking due to pain.   Goal status: IN PROGRESS  2.  Pt will decrease LUE fascial restrictions to min amounts or less to improve mobility required for functional reaching tasks.   Goal status: IN  PROGRESS  3.  Pt will increase LUE A/ROM by at least 25 degrees to improve ability to use LUE when reaching overhead or behind back during dressing and bathing tasks.   Goal status: IN PROGRESS  4.  Pt will increase LUE strength to 4/5 or greater to improve ability to use LUE when lifting or carrying items during meal preparation/housework/yardwork tasks.   Goal status: IN PROGRESS  5.  Pt will return to highest level of function using LUE as dominant during functional task completion.   Goal status: IN PROGRESS      ASSESSMENT:  CLINICAL IMPRESSION: Pt did not present with increased pain this session. OT was able to stretch him to end ranges with P/ROM, which he tolerated well, however he continues to have stiffness in his wrist extensors. Pt worked with theraputty this session, working on pulling it apart by pinching it, then flattening the putty by pushing through the heel of the wrist, forcing wrist extension and digit extension. This required increased time, due to pt fatigue and attention. OT providing max verbal and tactile cuing for attention, sequencing and technique.    PERFORMANCE DEFICITS: in functional skills including ADLs, IADLs, coordination, dexterity, proprioception, sensation, edema, tone, ROM, strength, pain, fascial restrictions, Fine motor control, endurance, and UE functional use, cognitive skills including attention, emotional, and thought, and psychosocial skills including coping strategies.     PLAN:  OT FREQUENCY: 2x/week  OT DURATION: 8 weeks  PLANNED INTERVENTIONS: self care/ADL training, therapeutic exercise, therapeutic activity, neuromuscular re-education, manual therapy, passive range of motion, splinting, electrical stimulation, paraffin, patient/family education, and DME and/or AE instructions  RECOMMENDED OTHER SERVICES: follow up with MD regarding sleep  CONSULTED AND AGREED WITH PLAN OF CARE: Patient  PLAN FOR NEXT SESSION: Follow up on  HEP, P/ROM of hand and wrist, NMES for wrist extension, tendon glides, gentle A/ROM, pinch strengthening and grip strengthening   Dallie Dad [facility] [phone] 08/17/2022, 9:48 AM

## 2022-08-22 ENCOUNTER — Ambulatory Visit (HOSPITAL_COMMUNITY): Payer: BLUE CROSS/BLUE SHIELD | Admitting: Occupational Therapy

## 2022-08-22 ENCOUNTER — Encounter (HOSPITAL_COMMUNITY): Payer: Self-pay | Admitting: Occupational Therapy

## 2022-08-22 DIAGNOSIS — M25532 Pain in left wrist: Secondary | ICD-10-CM | POA: Diagnosis not present

## 2022-08-22 DIAGNOSIS — M25632 Stiffness of left wrist, not elsewhere classified: Secondary | ICD-10-CM

## 2022-08-22 DIAGNOSIS — R29818 Other symptoms and signs involving the nervous system: Secondary | ICD-10-CM

## 2022-08-22 NOTE — Therapy (Signed)
OUTPATIENT OCCUPATIONAL THERAPY ORTHO TREATMENT  Patient Name: Dalton Guerrero MRN: [id number] DOB:11-Aug-1960, 62 y.o., male Today's Date: 08/22/2022  PCP: Dr. Gwen Pounds REFERRING PROVIDER: Bethann Berkshire, PA-C  END OF SESSION:  OT End of Session - 08/22/22 0949     Visit Number 8    Number of Visits 16    Date for OT Re-Evaluation 09/09/22    Authorization Type [employer]    OT Start Time [phone]    OT Stop Time 1028    OT Time Calculation (min) 40 min    Activity Tolerance Patient tolerated treatment well    Behavior During Therapy WFL for tasks assessed/performed             Past Medical History:  Diagnosis Date   Hypertension    Past Surgical History:  Procedure Laterality Date   KIDNEY SURGERY Bilateral    Pt. 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   ONSET DATE: 02/28/22  REFERRING DIAG: right RC tear, left radial nerve palsy, left wrist contracture  THERAPY DIAG:  Pain in left wrist  Stiffness of left wrist, not elsewhere classified  Other symptoms and signs involving the nervous system  Rationale for Evaluation and Treatment: Rehabilitation  SUBJECTIVE:   SUBJECTIVE STATEMENT: S: "I haven't slept at all in a few days"   PERTINENT HISTORY: Pt is a 62 y/o male presenting with right RC tear, left radial nerve palsy, and left wrist contracture. Pt presents in a standard prefabricated wrist splint today, minimal benefit to wrist and left hand. Pt fell several years ago landing on his right shoulder, then had surgery. In March, pt fell at work when tripping over a mat, then later tried to lift a box overhead and sustained a RC tear. Pt was scheduled for a reverse TSA, but has postponed due to the severity of deficits with his left hand and wrist. Pt has had a nerve conduction study completed, pt reports he was told that it would be 6 months before he saw improvement. Currently, the pt's primary concern is his left hand  and wrist, this is where he would like therapy to focus.   PRECAUTIONS: None  WEIGHT BEARING RESTRICTIONS: No  PAIN:  Are you having pain? Yes: NPRS scale: 6/10 Pain location: right shoulder Pain description: aching, sharp, sore Aggravating factors: movement, use, lifting Relieving factors: nothing  FALLS: Has patient fallen in last 6 months? Yes. Number of falls 2  PLOF: Independent  PATIENT GOALS: To get his left hand working again.   NEXT MD VISIT: unsure  OBJECTIVE:   HAND DOMINANCE: Left  ADLs: Overall ADLs: Pt reports he can wash the left side of his body and he can lift a lightweight milk jug with his left hand, otherwise he is unable to use his left hand functionally. He has been soaking his hand in epsom salts and hot water to stretch it.   FUNCTIONAL OUTCOME MEASURES: Quick Dash: 81.82  UPPER EXTREMITY ROM:     Active ROM Left eval  Elbow flexion 130  Elbow extension 0  Wrist flexion 50  Wrist extension 0  Wrist ulnar deviation 0  Wrist radial deviation 0  Wrist pronation 45  Wrist supination 90  (Blank rows = not tested)  Active ROM Left eval  Thumb MCP (0-60) 72  Thumb IP (0-80) 21  Thumb Opposition to Small Finger  0  Index MCP (0-90)  72  Index PIP (0-100)  62  Index DIP (0-70)  30  Long MCP (0-90)  68  Long PIP (0-100)  62  Long DIP (0-70)  55  Ring MCP (0-90)  58  Ring PIP (0-100)  82  Ring DIP (0-70)  40  Little MCP (0-90)  74  Little PIP (0-100)  58  Little DIP (0-70)  45  (Blank rows = not tested)   UPPER EXTREMITY MMT:     MMT Left eval  Elbow flexion 5/5  Elbow extension 5/5  Wrist flexion 3-/5  Wrist extension 1/5  Wrist ulnar deviation 0/5  Wrist radial deviation 0/5  Wrist pronation 2/5  Wrist supination 3-/5  (Blank rows = not tested)  HAND FUNCTION: Grip strength: Right: 68 lbs; Left: 10 lbs, Lateral pinch: Right: 13 lbs, Left: 6 lbs, and 3 point pinch: Right: 14 lbs, Left: 1 lbs  COORDINATION: Unable to  complete due to deficits  SENSATION: Light touch: WFL  EDEMA: None noted  COGNITION: Overall cognitive status: Difficulty to assess due to: lack of sleep, emotional, tangential   TODAY'S TREATMENT:                                                                                                                              DATE:   08/22/22 -Theraputty: yellow, roll into ball, flatten into pancake, roll into log, lateral pinch down, roll into log, tripod pinch down -Pinch Strength: yellow and red resistance clips, pinch each clip x10 then place on thickest horizontal pole x5 each clip color, lateral pinch up, tripod pinch down -Manual Therapy: myofascial release and trigger point, applied to L forearm flexors and extensors to address fascial restrictions and pain in order to improve ROM.  08/17/22 -P/ROM: wrist flexion/extension, ulnar/radial deviation, supination/pronation, digit composite flexion/extension, x10 -Wrist A/ROM: flexion, extension, ulnar deviation, radial deviation, supination, pronation, x10 -Digit ROM: composite flexion abduction/adduction, digit opposition, x10 each -Theraputty: yellow, pulling apart, roll into a ball, flatten into pancake, use pvc pipe to press out cookies x30, roll into a ball, squeeze x10 -Weighted stretches: 2lb dumbbell flexion and extension  08/15/22 -Manual Therapy: myofascial release and trigger point, applied to L forearm flexors and extensors to address fascial restrictions and pain in order to improve ROM. -P/ROM: wrist flexion/extension, ulnar/radial deviation, supination/pronation, digit composite flexion/extension, x10 -Wrist A/ROM: flexion, extension, ulnar deviation, radial deviation, supination, pronation, x10 -Digit ROM: composite flexion abduction/adduction, digit opposition, x10 each -Digiflex: 3lbs, full squeeze x10, each digit flexing x5 -Gripper: 7lbs and 11lb squeeze x 10 each -Weighted stretches: 2lb dumbbell flexion and  extension -Pinch strengthening: yellow and red resistance clips, tripod and lateral pinch, x10 each  08/09/22 -P/ROM: wrist flexion/extension, ulnar/radial deviation, supination/pronation, digit composite flexion/extension, x10 -Wrist A/ROM: flexion, extension, ulnar deviation, radial deviation, supination, pronation, x10 -Digit ROM: composite flexion, extension, abduction/adduction, digit opposition -Weight bearing: palms flat on table, wrists extended, and weight shifting 2x45" - Weight bearing through elbows with palms flat on table and elbows flexed 2x60" -  NMES: [place] setting, 10/10 pattern, up to 35 mA CC, assisting L wrist into extension while providing stimulation, pt actively going into wrist flexion and making a fist when no simulation is provided.  -LUE Tendon glides x10 - modified due to pt limited ROM, OT providing mod assist to achieve proper positions.      PATIENT EDUCATION: Education details: Requesting pt follow up with MD regarding sleep Person educated: Patient Education method: Explanation, Demonstration, and Handouts Education comprehension: verbalized understanding and returned demonstration  HOME EXERCISE PROGRAM: Eval: weightbearing 1/5: Radial Nerve Tendon Glide 1/19: Digit ROM 1/30: weighted wrist stretches 2/1: Theraputty Exercise    GOALS: Goals reviewed with patient? Yes  SHORT TERM GOALS: Target date: 08/08/22  Pt will be provided with and educated on HEP to improve mobility in LUE required for use during ADL completion.   Goal status: IN PROGRESS  2.  Pt will be provided with and educated on splint wear and care for radial nerve palsy, demonstrating independence in donning and doffing.   Goal status: IN PROGRESS  3.  Pt will increase left wrist strength to 3+/5 to improve ability to reach for items using his left hand and maintain a light grasp.   Goal status: IN PROGRESS  4.  Pt will increase left grip strength by 5# and pinch strength by  2# to improve ability to grasp and maintain hold on tools for ADL completion such as a cup, toothbrush, clothing, etc.   Goal status: IN PROGRESS    LONG TERM GOALS: Target date: 09/09/22  Pt will decrease pain in LUE to 3/10 or less to improve ability to sleep for 2+ consecutive hours without waking due to pain.   Goal status: IN PROGRESS  2.  Pt will decrease LUE fascial restrictions to min amounts or less to improve mobility required for functional reaching tasks.   Goal status: IN PROGRESS  3.  Pt will increase LUE A/ROM by at least 25 degrees to improve ability to use LUE when reaching overhead or behind back during dressing and bathing tasks.   Goal status: IN PROGRESS  4.  Pt will increase LUE strength to 4/5 or greater to improve ability to use LUE when lifting or carrying items during meal preparation/housework/yardwork tasks.   Goal status: IN PROGRESS  5.  Pt will return to highest level of function using LUE as dominant during functional task completion.   Goal status: IN PROGRESS      ASSESSMENT:  CLINICAL IMPRESSION: This session pt reporting that he has not slept in a few days, he appears tired and is having increased difficulty focusing on tasks this session. Focused on hand strengthening and functional wrist movement tasks. OT provided maximal cuing for extended wrist to neutral to keep it out of flexion. Maximal cuing provided for attention throughout the session. Towards the end of the session pt became very tearful due to his situation, limited sleep, and continued limitations with his L hand and wrist. OT recommending pt follow up with his MD regarding changing his sleep medication.    PERFORMANCE DEFICITS: in functional skills including ADLs, IADLs, coordination, dexterity, proprioception, sensation, edema, tone, ROM, strength, pain, fascial restrictions, Fine motor control, endurance, and UE functional use, cognitive skills including attention, emotional,  and thought, and psychosocial skills including coping strategies.     PLAN:  OT FREQUENCY: 2x/week  OT DURATION: 8 weeks  PLANNED INTERVENTIONS: self care/ADL training, therapeutic exercise, therapeutic activity, neuromuscular re-education, manual therapy, passive range of motion,  splinting, electrical stimulation, paraffin, patient/family education, and DME and/or AE instructions  RECOMMENDED OTHER SERVICES: follow up with MD regarding sleep  CONSULTED AND AGREED WITH PLAN OF CARE: Patient  PLAN FOR NEXT SESSION: Follow up on HEP, P/ROM of hand and wrist, NMES for wrist extension, tendon glides, gentle A/ROM, pinch strengthening and grip strengthening   Dallie Dad [facility] Rehab [phone] 08/22/2022, 10:14 AM

## 2022-08-24 ENCOUNTER — Encounter (HOSPITAL_COMMUNITY): Payer: Self-pay | Admitting: Occupational Therapy

## 2022-08-24 ENCOUNTER — Ambulatory Visit (HOSPITAL_COMMUNITY): Payer: BLUE CROSS/BLUE SHIELD | Admitting: Occupational Therapy

## 2022-08-24 DIAGNOSIS — M25532 Pain in left wrist: Secondary | ICD-10-CM | POA: Diagnosis not present

## 2022-08-24 DIAGNOSIS — R29818 Other symptoms and signs involving the nervous system: Secondary | ICD-10-CM

## 2022-08-24 DIAGNOSIS — M25632 Stiffness of left wrist, not elsewhere classified: Secondary | ICD-10-CM

## 2022-08-24 NOTE — Therapy (Signed)
OUTPATIENT OCCUPATIONAL THERAPY ORTHO TREATMENT NOTE  Patient Name: Dalton Guerrero MRN: [id number] DOB:24-Apr-1961, 62 y.o., male Today's Date: 08/24/2022  PCP: Dr. Modena Morrow REFERRING PROVIDER: [name], PA-C  END OF SESSION:  OT End of Session - 08/24/22 0907     Visit Number 9    Number of Visits 16    Date for OT Re-Evaluation 09/09/22    Authorization Type [employer]BS    OT Start Time 0905    OT Stop Time 0945    OT Time Calculation (min) 40 min    Activity Tolerance Patient tolerated treatment well    Behavior During Therapy WFL for tasks assessed/performed             Past Medical History:  Diagnosis Date   Hypertension    Past Surgical History:  Procedure Laterality Date   KIDNEY SURGERY Bilateral    Pt. 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   ONSET DATE: 02/28/22  REFERRING DIAG: right RC tear, left radial nerve palsy, left wrist contracture  THERAPY DIAG:  Pain in left wrist  Stiffness of left wrist, not elsewhere classified  Other symptoms and signs involving the nervous system  Rationale for Evaluation and Treatment: Rehabilitation  SUBJECTIVE:   SUBJECTIVE STATEMENT: S: "Between my shoulder, my hip, and this arm, nothing is working right."   PERTINENT HISTORY: Pt is a 62 y/o male presenting with right RC tear, left radial nerve palsy, and left wrist contracture. Pt presents in a standard prefabricated wrist splint today, minimal benefit to wrist and left hand. Pt fell several years ago landing on his right shoulder, then had surgery. In March, pt fell at work when tripping over a mat, then later tried to lift a box overhead and sustained a RC tear. Pt was scheduled for a reverse TSA, but has postponed due to the severity of deficits with his left hand and wrist. Pt has had a nerve conduction study completed, pt reports he was told that it would be 6 months before he saw improvement. Currently, the  pt's primary concern is his left hand and wrist, this is where he would like therapy to focus.   PRECAUTIONS: None  WEIGHT BEARING RESTRICTIONS: No  PAIN:  Are you having pain? Yes: NPRS scale: 6/10 Pain location: right shoulder Pain description: aching, sharp, sore Aggravating factors: movement, use, lifting Relieving factors: nothing  FALLS: Has patient fallen in last 6 months? Yes. Number of falls 2  PLOF: Independent  PATIENT GOALS: To get his left hand working again.   NEXT MD VISIT: unsure  OBJECTIVE:   HAND DOMINANCE: Left  ADLs: Overall ADLs: Pt reports he can wash the left side of his body and he can lift a lightweight milk jug with his left hand, otherwise he is unable to use his left hand functionally. He has been soaking his hand in epsom salts and hot water to stretch it.   FUNCTIONAL OUTCOME MEASURES: Quick Dash: 81.82  UPPER EXTREMITY ROM:     Active ROM Left eval  Elbow flexion 130  Elbow extension 0  Wrist flexion 50  Wrist extension 0  Wrist ulnar deviation 0  Wrist radial deviation 0  Wrist pronation 45  Wrist supination 90  (Blank rows = not tested)  Active ROM Left eval  Thumb MCP (0-60) 72  Thumb IP (0-80) 21  Thumb Opposition to Small Finger  0  Index MCP (0-90)  72  Index  PIP (0-100)  62  Index DIP (0-70)  30  Long MCP (0-90)  68  Long PIP (0-100)  62  Long DIP (0-70)  55  Ring MCP (0-90)  58  Ring PIP (0-100)  82  Ring DIP (0-70)  40  Little MCP (0-90)  74  Little PIP (0-100)  58  Little DIP (0-70)  45  (Blank rows = not tested)   UPPER EXTREMITY MMT:     MMT Left eval  Elbow flexion 5/5  Elbow extension 5/5  Wrist flexion 3-/5  Wrist extension 1/5  Wrist ulnar deviation 0/5  Wrist radial deviation 0/5  Wrist pronation 2/5  Wrist supination 3-/5  (Blank rows = not tested)  HAND FUNCTION: Grip strength: Right: 68 lbs; Left: 10 lbs, Lateral pinch: Right: 13 lbs, Left: 6 lbs, and 3 point pinch: Right: 14 lbs, Left:  1 lbs  COORDINATION: Unable to complete due to deficits  SENSATION: Light touch: WFL  EDEMA: None noted  COGNITION: Overall cognitive status: Difficulty to assess due to: lack of sleep, emotional, tangential   TODAY'S TREATMENT:                                                                                                                              DATE:   08/24/22 -P/ROM: wrist flexion/extension, ulnar/radial deviation, supination/pronation, digit composite flexion, x10 with 3-5" holds in end ranges -Digit Blocking at each joint, x10 each -Digit ROM: flexion/extension, abduction/adduction, opposition, x10 -Weighted Stretches: 4lb dumbbell, extension, supination -Wrist Strengthening: 2lb dumbbell, flexion, supination/pronation, No weight - radial/ulnar deviation, extension  08/22/22 -Theraputty: yellow, roll into ball, flatten into pancake, roll into log, lateral pinch down, roll into log, tripod pinch down -Pinch Strength: yellow and red resistance clips, pinch each clip x10 then place on thickest horizontal pole x5 each clip color, lateral pinch up, tripod pinch down -Manual Therapy: myofascial release and trigger point, applied to L forearm flexors and extensors to address fascial restrictions and pain in order to improve ROM.  08/17/22 -P/ROM: wrist flexion/extension, ulnar/radial deviation, supination/pronation, digit composite flexion/extension, x10 -Wrist A/ROM: flexion, extension, ulnar deviation, radial deviation, supination, pronation, x10 -Digit ROM: composite flexion abduction/adduction, digit opposition, x10 each -Theraputty: yellow, pulling apart, roll into a ball, flatten into pancake, use pvc pipe to press out cookies x30, roll into a ball, squeeze x10 -Weighted stretches: 2lb dumbbell flexion and extension     PATIENT EDUCATION: Education details: Wrist A/ROM Person educated: Patient Education method: Consulting civil engineer, Demonstration, and Handouts Education  comprehension: verbalized understanding and returned demonstration  HOME EXERCISE PROGRAM: Eval: weightbearing 1/5: Radial Nerve Tendon Glide 1/19: Digit ROM 1/30: weighted wrist stretches 2/1: Theraputty Exercise 2/8: Wrist A/ROM    GOALS: Goals reviewed with patient? Yes  SHORT TERM GOALS: Target date: 08/08/22  Pt will be provided with and educated on HEP to improve mobility in LUE required for use during ADL completion.   Goal status: IN PROGRESS  2.  Pt  will be provided with and educated on splint wear and care for radial nerve palsy, demonstrating independence in donning and doffing.   Goal status: IN PROGRESS  3.  Pt will increase left wrist strength to 3+/5 to improve ability to reach for items using his left hand and maintain a light grasp.   Goal status: IN PROGRESS  4.  Pt will increase left grip strength by 5# and pinch strength by 2# to improve ability to grasp and maintain hold on tools for ADL completion such as a cup, toothbrush, clothing, etc.   Goal status: IN PROGRESS    LONG TERM GOALS: Target date: 09/09/22  Pt will decrease pain in LUE to 3/10 or less to improve ability to sleep for 2+ consecutive hours without waking due to pain.   Goal status: IN PROGRESS  2.  Pt will decrease LUE fascial restrictions to min amounts or less to improve mobility required for functional reaching tasks.   Goal status: IN PROGRESS  3.  Pt will increase LUE A/ROM by at least 25 degrees to improve ability to use LUE when reaching overhead or behind back during dressing and bathing tasks.   Goal status: IN PROGRESS  4.  Pt will increase LUE strength to 4/5 or greater to improve ability to use LUE when lifting or carrying items during meal preparation/housework/yardwork tasks.   Goal status: IN PROGRESS  5.  Pt will return to highest level of function using LUE as dominant during functional task completion.   Goal status: IN PROGRESS       ASSESSMENT:  CLINICAL IMPRESSION: Pt continuing to report poor sleep habits, pain, and difficulty with self care tasks. This session focusing on ROM, with stretches for end ranges, as well as encouraging digit ROM. OT added wrist strengthening this session with light weights to initiate strengthening. Pt continuing to need maximal assist to participate in therapy, follow correct sequencing, and focusing on the tasks. OT providing verbal and tactile cuing for position and technique.    PERFORMANCE DEFICITS: in functional skills including ADLs, IADLs, coordination, dexterity, proprioception, sensation, edema, tone, ROM, strength, pain, fascial restrictions, Fine motor control, endurance, and UE functional use, cognitive skills including attention, emotional, and thought, and psychosocial skills including coping strategies.     PLAN:  OT FREQUENCY: 2x/week  OT DURATION: 8 weeks  PLANNED INTERVENTIONS: self care/ADL training, therapeutic exercise, therapeutic activity, neuromuscular re-education, manual therapy, passive range of motion, splinting, electrical stimulation, paraffin, patient/family education, and DME and/or AE instructions  RECOMMENDED OTHER SERVICES: follow up with MD regarding sleep  CONSULTED AND AGREED WITH PLAN OF CARE: Patient  PLAN FOR NEXT SESSION: Follow up on HEP, P/ROM of hand and wrist, NMES for wrist extension, tendon glides, gentle A/ROM, pinch strengthening and grip strengthening   Dallie Dad [facility]hab [phone] 08/24/2022, 9:08 AM

## 2022-08-29 ENCOUNTER — Encounter (HOSPITAL_COMMUNITY): Payer: BLUE CROSS/BLUE SHIELD | Admitting: Occupational Therapy

## 2022-08-31 ENCOUNTER — Ambulatory Visit (HOSPITAL_COMMUNITY): Payer: BLUE CROSS/BLUE SHIELD | Admitting: Occupational Therapy

## 2022-08-31 ENCOUNTER — Encounter (HOSPITAL_COMMUNITY): Payer: Self-pay | Admitting: Occupational Therapy

## 2022-08-31 DIAGNOSIS — M25532 Pain in left wrist: Secondary | ICD-10-CM | POA: Diagnosis not present

## 2022-08-31 DIAGNOSIS — M25632 Stiffness of left wrist, not elsewhere classified: Secondary | ICD-10-CM

## 2022-08-31 DIAGNOSIS — R29818 Other symptoms and signs involving the nervous system: Secondary | ICD-10-CM

## 2022-08-31 NOTE — Therapy (Signed)
OUTPATIENT OCCUPATIONAL THERAPY ORTHO TREATMENT NOTE AND PROGRESS NOTE  Patient Name: Dalton Guerrero MRN: [id number] DOB:[birth year], 62 y.o., male Today's Date: 08/31/2022  PCP: Dr. Modena Morrow REFERRING PROVIDER: [name], PA-C  Progress Note Reporting Period 07/12/23 to 08/31/22  See note below for Objective Data and Assessment of Progress/Goals.    END OF SESSION:  OT End of Session - 08/31/22 0903     Visit Number 10    Number of Visits 16    Date for OT Re-Evaluation 09/09/22    Authorization Type [employer]    OT Start Time 0905    OT Stop Time 0945    OT Time Calculation (min) 40 min    Activity Tolerance Patient tolerated treatment well    Behavior During Therapy WFL for tasks assessed/performed             Past Medical History:  Diagnosis Date   Hypertension    Past Surgical History:  Procedure Laterality Date   KIDNEY SURGERY Bilateral    Pt. 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   ONSET DATE: 02/28/22  REFERRING DIAG: right RC tear, left radial nerve palsy, left wrist contracture  THERAPY DIAG:  Pain in left wrist  Stiffness of left wrist, not elsewhere classified  Other symptoms and signs involving the nervous system  Rationale for Evaluation and Treatment: Rehabilitation  SUBJECTIVE:   SUBJECTIVE STATEMENT: S: "I am not sleeping at all, I did go to the doctor and he gave me some medication"   PERTINENT HISTORY: Pt is a 62 y/o male presenting with right RC tear, left radial nerve palsy, and left wrist contracture. Pt presents in a standard prefabricated wrist splint today, minimal benefit to wrist and left hand. Pt fell several years ago landing on his right shoulder, then had surgery. In March, pt fell at work when tripping over a mat, then later tried to lift a box overhead and sustained a RC tear. Pt was scheduled for a reverse TSA, but has postponed due to the severity of deficits with his  left hand and wrist. Pt has had a nerve conduction study completed, pt reports he was told that it would be 6 months before he saw improvement. Currently, the pt's primary concern is his left hand and wrist, this is where he would like therapy to focus.   PRECAUTIONS: None  WEIGHT BEARING RESTRICTIONS: No  PAIN:  Are you having pain? Yes: NPRS scale: 5/10 Pain location: L wrist, 12/10 right shoulder Pain description: aching, sharp, sore Aggravating factors: movement, use, lifting Relieving factors: nothing  FALLS: Has patient fallen in last 6 months? Yes. Number of falls 2  PLOF: Independent  PATIENT GOALS: To get his left hand working again.   NEXT MD VISIT: unsure  OBJECTIVE:   HAND DOMINANCE: Left  ADLs: Overall ADLs: Pt reports he can wash the left side of his body and he can lift a lightweight milk jug with his left hand, otherwise he is unable to use his left hand functionally. He has been soaking his hand in epsom salts and hot water to stretch it.   FUNCTIONAL OUTCOME MEASURES: Quick Dash: 81.82 Quick Dash: (08/31/22):   UPPER EXTREMITY ROM:     Active ROM Left eval Left 08/31/22  Elbow flexion 130 140  Elbow extension 0 0  Wrist flexion 50 51  Wrist extension 0 16  Wrist ulnar deviation 0 24  Wrist radial deviation 0 6  Wrist pronation 45 WFL  Wrist supination 90 WFL  (Blank rows = not tested)  Active ROM Left eval Left 08/31/22  Thumb MCP (0-60) 72 65  Thumb IP (0-80) 21 50  Thumb Opposition to Small Finger  0 Able  Index MCP (0-90)  72 65  Index PIP (0-100)  62 95  Index DIP (0-70)  30 60  Long MCP (0-90)  68 75  Long PIP (0-100)  62 85  Long DIP (0-70)  55 55  Ring MCP (0-90)  58 85  Ring PIP (0-100)  82 85  Ring DIP (0-70)  40 55  Little MCP (0-90)  74 85  Little PIP (0-100)  58 80  Little DIP (0-70)  45 55  (Blank rows = not tested)   UPPER EXTREMITY MMT:     MMT Left eval Left 08/31/22  Elbow flexion 5/5 5/5  Elbow extension 5/5 5/5   Wrist flexion 3-/5 4+/5  Wrist extension 1/5 3/5  Wrist ulnar deviation 0/5 4-/5  Wrist radial deviation 0/5 3+/5  Wrist pronation 2/5 3+/5  Wrist supination 3-/5 4/5  (Blank rows = not tested)  HAND FUNCTION: Grip strength: Right: 68 lbs; Left: 10 lbs, Lateral pinch: Right: 13 lbs, Left: 6 lbs, and 3 point pinch: Right: 14 lbs, Left: 1 lbs Grip strength: (08/31/22)Left: 20 lbs, Lateral pinch: Left: 8 lbs, and 3 point pinch: Left: 9 lbs  COORDINATION: Unable to complete due to deficits 08/31/22: 9 Hole Peg Test: Right: 23.15 sec; Left: 41.42 sec  SENSATION: Light touch: WFL  EDEMA: None noted               08/31/22: mild swelling along the dorsal aspect of the hand  COGNITION: Overall cognitive status: Difficulty to assess due to: lack of sleep, emotional, tangential   TODAY'S TREATMENT:                                                                                                                              DATE:   08/31/22 -P/ROM: wrist flexion/extension, ulnar/radial deviation, supination/pronation, digit composite flexion, x10 with 3-5" holds in end ranges -A/ROM: wrist flexion/extension, ulnar/radial deviation, supination/pronation, x10, wrist propped on bolster -Digit ROM:  flexion/extension, abduction/adduction, opposition, x10 -Measurements for Progress note -9 hole peg test -Paraffin Bath: dipped x3 with hand in composite flexion, wrapped in moist heat for 8 mins. Completed to assist with pain relief, as well as stiffness in all digits of the LUE. Pt tolerated well and reported that it has seemed to help ease/relax his hand more.   08/24/22 -P/ROM: wrist flexion/extension, ulnar/radial deviation, supination/pronation, digit composite flexion, x10 with 3-5" holds in end ranges -Digit Blocking at each joint, x10 each -Digit ROM: flexion/extension, abduction/adduction, opposition, x10 -Weighted Stretches: 4lb dumbbell, extension, supination -Wrist Strengthening: 2lb  dumbbell, flexion, supination/pronation, No weight - radial/ulnar deviation, extension  08/22/22 -Theraputty: yellow, roll into ball, flatten into pancake, roll into log, lateral pinch down, roll into  log, tripod pinch down -Pinch Strength: yellow and red resistance clips, pinch each clip x10 then place on thickest horizontal pole x5 each clip color, lateral pinch up, tripod pinch down -Manual Therapy: myofascial release and trigger point, applied to L forearm flexors and extensors to address fascial restrictions and pain in order to improve ROM.    PATIENT EDUCATION: Education details: Reviewed HEP Person educated: Patient Education method: Explanation, Demonstration, and Handouts Education comprehension: verbalized understanding and returned demonstration  HOME EXERCISE PROGRAM: Eval: weightbearing 1/5: Radial Nerve Tendon Glide 1/19: Digit ROM 1/30: weighted wrist stretches 2/1: Theraputty Exercise 2/8: Wrist A/ROM    GOALS: Goals reviewed with patient? Yes  SHORT TERM GOALS: Target date: 08/08/22  Pt will be provided with and educated on HEP to improve mobility in LUE required for use during ADL completion.   Goal status: IN PROGRESS  2.  Pt will be provided with and educated on splint wear and care for radial nerve palsy, demonstrating independence in donning and doffing.   Goal status: MET  3.  Pt will increase left wrist strength to 3+/5 to improve ability to reach for items using his left hand and maintain a light grasp.   Goal status: IN PROGRESS  4.  Pt will increase left grip strength by 5# and pinch strength by 2# to improve ability to grasp and maintain hold on tools for ADL completion such as a cup, toothbrush, clothing, etc.   Goal status: MET    LONG TERM GOALS: Target date: 09/09/22  Pt will decrease pain in LUE to 3/10 or less to improve ability to sleep for 2+ consecutive hours without waking due to pain.   Goal status: IN PROGRESS  2.  Pt will  decrease LUE fascial restrictions to min amounts or less to improve mobility required for functional reaching tasks.   Goal status: IN PROGRESS  3.  Pt will increase LUE A/ROM by at least 25 degrees to improve ability to use LUE when reaching overhead or behind back during dressing and bathing tasks.   Goal status: IN PROGRESS  4.  Pt will increase LUE strength to 4/5 or greater to improve ability to use LUE when lifting or carrying items during meal preparation/housework/yardwork tasks.   Goal status: IN PROGRESS  5.  Pt will return to highest level of function using LUE as dominant during functional task completion.   Goal status: IN PROGRESS      ASSESSMENT:  CLINICAL IMPRESSION: Pt was reassessed this session for his progress note. He continues to report difficulty with holding on to objects and pain shooting from his elbow down his fingers, as well as concern over lack of ability to extend his digits. OT and pt continued to work on ROM this session, and OT added paraffin bath and moist heat to assist with easing pain and stiffness in all digit joints. Verbal and tactile cuing provided for focus/attending to tasks, as well as technique and positioning throughout tasks.    PERFORMANCE DEFICITS: in functional skills including ADLs, IADLs, coordination, dexterity, proprioception, sensation, edema, tone, ROM, strength, pain, fascial restrictions, Fine motor control, endurance, and UE functional use, cognitive skills including attention, emotional, and thought, and psychosocial skills including coping strategies.     PLAN:  OT FREQUENCY: 2x/week  OT DURATION: 8 weeks  PLANNED INTERVENTIONS: self care/ADL training, therapeutic exercise, therapeutic activity, neuromuscular re-education, manual therapy, passive range of motion, splinting, electrical stimulation, paraffin, patient/family education, and DME and/or AE instructions  RECOMMENDED OTHER SERVICES:  follow up with MD regarding  sleep  CONSULTED AND AGREED WITH PLAN OF CARE: Patient  PLAN FOR NEXT SESSION: Follow up on HEP, P/ROM of hand and wrist, Try NMES for wrist extension again, tendon glides, gentle A/ROM, pinch strengthening and grip strengthening, Paraffin bath when needed   Dallie Dad [facility] [phone] 08/31/2022, 9:04 AM

## 2022-09-04 ENCOUNTER — Encounter (HOSPITAL_COMMUNITY): Payer: Self-pay | Admitting: Occupational Therapy

## 2022-09-04 ENCOUNTER — Ambulatory Visit (HOSPITAL_COMMUNITY): Payer: BLUE CROSS/BLUE SHIELD | Admitting: Occupational Therapy

## 2022-09-04 DIAGNOSIS — M25632 Stiffness of left wrist, not elsewhere classified: Secondary | ICD-10-CM

## 2022-09-04 DIAGNOSIS — M25532 Pain in left wrist: Secondary | ICD-10-CM

## 2022-09-04 DIAGNOSIS — R29818 Other symptoms and signs involving the nervous system: Secondary | ICD-10-CM

## 2022-09-04 NOTE — Therapy (Signed)
OUTPATIENT OCCUPATIONAL THERAPY ORTHO TREATMENT NOTE  Patient Name: Dalton Guerrero MRN: [id number] DOB:04-Apr-1961, 62 y.o., male Today's Date: 09/04/2022  PCP: Dr. Modena Morrow REFERRING PROVIDER: [name], PA-C  END OF SESSION:  OT End of Session - 09/04/22 1115     Visit Number 11    Number of Visits 16    Date for OT Re-Evaluation 09/09/22    Authorization Type [employer]    OT Start Time 1034    OT Stop Time 1115    OT Time Calculation (min) 41 min    Activity Tolerance Patient tolerated treatment well    Behavior During Therapy WFL for tasks assessed/performed              Past Medical History:  Diagnosis Date   Hypertension    Past Surgical History:  Procedure Laterality Date   KIDNEY SURGERY Bilateral    Pt. 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   ONSET DATE: 02/28/22  REFERRING DIAG: right RC tear, left radial nerve palsy, left wrist contracture  THERAPY DIAG:  Pain in left wrist  Stiffness of left wrist, not elsewhere classified  Other symptoms and signs involving the nervous system  Rationale for Evaluation and Treatment: Rehabilitation  SUBJECTIVE:   SUBJECTIVE STATEMENT: S: "The brace is working well."    PERTINENT HISTORY: Pt is a 61 y/o male presenting with right RC tear, left radial nerve palsy, and left wrist contracture. Pt presents in a standard prefabricated wrist splint today, minimal benefit to wrist and left hand. Pt fell several years ago landing on his right shoulder, then had surgery. In March, pt fell at work when tripping over a mat, then later tried to lift a box overhead and sustained a RC tear. Pt was scheduled for a reverse TSA, but has postponed due to the severity of deficits with his left hand and wrist. Pt has had a nerve conduction study completed, pt reports he was told that it would be 6 months before he saw improvement. Currently, the pt's primary concern is his left hand  and wrist, this is where he would like therapy to focus.   PRECAUTIONS: None  WEIGHT BEARING RESTRICTIONS: No  PAIN:  Are you having pain? Yes: NPRS scale: 6/10 Pain location: L wrist, 9/10 right shoulder Pain description: aching, sharp, sore Aggravating factors: movement, use, lifting Relieving factors: nothing  FALLS: Has patient fallen in last 6 months? Yes. Number of falls 2  PATIENT GOALS: To get his left hand working again.   NEXT MD VISIT: unsure  OBJECTIVE:   HAND DOMINANCE: Left  ADLs: Overall ADLs: Pt reports he can wash the left side of his body and he can lift a lightweight milk jug with his left hand, otherwise he is unable to use his left hand functionally. He has been soaking his hand in epsom salts and hot water to stretch it.   FUNCTIONAL OUTCOME MEASURES: Quick Dash: 81.82 Quick Dash: (09/04/22): 59.09  UPPER EXTREMITY ROM:     Active ROM Left eval Left 08/31/22  Elbow flexion 130 140  Elbow extension 0 0  Wrist flexion 50 51  Wrist extension 0 16  Wrist ulnar deviation 0 24  Wrist radial deviation 0 6  Wrist pronation 45 WFL  Wrist supination 90 WFL  (Blank rows = not tested)  Active ROM Left eval Left 08/31/22  Thumb MCP (0-60) 72 65  Thumb IP (0-80) 21 50  Thumb Opposition  to Small Finger  0 Able  Index MCP (0-90)  72 65  Index PIP (0-100)  62 95  Index DIP (0-70)  30 60  Long MCP (0-90)  68 75  Long PIP (0-100)  62 85  Long DIP (0-70)  55 55  Ring MCP (0-90)  58 85  Ring PIP (0-100)  82 85  Ring DIP (0-70)  40 55  Little MCP (0-90)  74 85  Little PIP (0-100)  58 80  Little DIP (0-70)  45 55  (Blank rows = not tested)   UPPER EXTREMITY MMT:     MMT Left eval Left 08/31/22  Elbow flexion 5/5 5/5  Elbow extension 5/5 5/5  Wrist flexion 3-/5 4+/5  Wrist extension 1/5 3/5  Wrist ulnar deviation 0/5 4-/5  Wrist radial deviation 0/5 3+/5  Wrist pronation 2/5 3+/5  Wrist supination 3-/5 4/5  (Blank rows = not tested)  HAND  FUNCTION: Grip strength: Right: 68 lbs; Left: 10 lbs, Lateral pinch: Right: 13 lbs, Left: 6 lbs, and 3 point pinch: Right: 14 lbs, Left: 1 lbs Grip strength: (08/31/22)Left: 20 lbs, Lateral pinch: Left: 8 lbs, and 3 point pinch: Left: 9 lbs  COORDINATION: Unable to complete due to deficits 08/31/22: 9 Hole Peg Test: Right: 23.15 sec; Left: 41.42 sec  SENSATION: Light touch: WFL  EDEMA: None noted               08/31/22: mild swelling along the dorsal aspect of the hand  COGNITION: Overall cognitive status: Difficulty to assess due to: lack of sleep, emotional, tangential   TODAY'S TREATMENT:                                                                                                                              DATE:  09/04/22 -A/ROM: wrist flexion/extension, ulnar/radial deviation, supination/pronation, x10, wrist propped on towel roll  -Hand gripper: large beads hand gripper vertical at 15#, medium beads gripper vertical at 20#-fatigue noted at end of task with wrist flexion prominent during grasp and place -Wrist self-stretch: extension, 2x20" holds, one at door and one on table -Fine Motor Coordination: pt completing grooved pegboard, holding 5 pegs at a time and working on palm to fingertip translation to grasp and place pegs in pegboard using the left hand, increased time -Pinch strengthening: pt using red clothespin and 3 point pinch to grasp and stack 3 piles of  5 sponges  08/31/22 -P/ROM: wrist flexion/extension, ulnar/radial deviation, supination/pronation, digit composite flexion, x10 with 3-5" holds in end ranges -A/ROM: wrist flexion/extension, ulnar/radial deviation, supination/pronation, x10, wrist propped on bolster -Digit ROM:  flexion/extension, abduction/adduction, opposition, x10 -Measurements for Progress note -9 hole peg test -Paraffin Bath: dipped x3 with hand in composite flexion, wrapped in moist heat for 8 mins. Completed to assist with pain relief, as well as  stiffness in all digits of the LUE. Pt tolerated well and reported that it has seemed to help ease/relax his  hand more.   08/24/22 -P/ROM: wrist flexion/extension, ulnar/radial deviation, supination/pronation, digit composite flexion, x10 with 3-5" holds in end ranges -Digit Blocking at each joint, x10 each -Digit ROM: flexion/extension, abduction/adduction, opposition, x10 -Weighted Stretches: 4lb dumbbell, extension, supination -Wrist Strengthening: 2lb dumbbell, flexion, supination/pronation, No weight - radial/ulnar deviation, extension    PATIENT EDUCATION: Education details: Reviewed HEP Person educated: Patient Education method: Explanation, Demonstration, and Handouts Education comprehension: verbalized understanding and returned demonstration  HOME EXERCISE PROGRAM: Eval: weightbearing 1/5: Radial Nerve Tendon Glide 1/19: Digit ROM 1/30: weighted wrist stretches 2/1: Theraputty Exercise 2/8: Wrist A/ROM    GOALS: Goals reviewed with patient? Yes  SHORT TERM GOALS: Target date: 08/08/22  Pt will be provided with and educated on HEP to improve mobility in LUE required for use during ADL completion.   Goal status: IN PROGRESS  2.  Pt will be provided with and educated on splint wear and care for radial nerve palsy, demonstrating independence in donning and doffing.   Goal status: MET  3.  Pt will increase left wrist strength to 3+/5 to improve ability to reach for items using his left hand and maintain a light grasp.   Goal status: IN PROGRESS  4.  Pt will increase left grip strength by 5# and pinch strength by 2# to improve ability to grasp and maintain hold on tools for ADL completion such as a cup, toothbrush, clothing, etc.   Goal status: MET    LONG TERM GOALS: Target date: 09/09/22  Pt will decrease pain in LUE to 3/10 or less to improve ability to sleep for 2+ consecutive hours without waking due to pain.   Goal status: IN PROGRESS  2.  Pt will  decrease LUE fascial restrictions to min amounts or less to improve mobility required for functional reaching tasks.   Goal status: IN PROGRESS  3.  Pt will increase LUE A/ROM by at least 25 degrees to improve ability to use LUE when reaching overhead or behind back during dressing and bathing tasks.   Goal status: IN PROGRESS  4.  Pt will increase LUE strength to 4/5 or greater to improve ability to use LUE when lifting or carrying items during meal preparation/housework/yardwork tasks.   Goal status: IN PROGRESS  5.  Pt will return to highest level of function using LUE as dominant during functional task completion.   Goal status: IN PROGRESS      ASSESSMENT:  CLINICAL IMPRESSION: Pt reporting his brace is working well, this OT has not seen pt since his evaluation and notes great improvements in wrist mobility and strength, as well as grip and pinch strength and coordination. Pt is now able to activate his extensors to perform functional tasks. Fatigue noted with wrist extension A/ROM, added hand gripper activity today with success, pt with noted fatigue and increased compensatory wrist flexion towards end of task. Pt completing coordination task with focus on good form and no compensatory techniques. Pt reporting he wants injections for his fingers, citing past hx of injections helping other fingers, also continues to be emotional regarding lack of sleep. Reviewed progression of nerve healing and educated pt on timeline and progress thus far, encouraging pt to continue his HEP as he is making good gains. Verbal cuing for form and technique during tasks.    PERFORMANCE DEFICITS: in functional skills including ADLs, IADLs, coordination, dexterity, proprioception, sensation, edema, tone, ROM, strength, pain, fascial restrictions, Fine motor control, endurance, and UE functional use, cognitive skills including attention, emotional, and  thought, and psychosocial skills including coping  strategies.     PLAN:  OT FREQUENCY: 2x/week  OT DURATION: 8 weeks  PLANNED INTERVENTIONS: self care/ADL training, therapeutic exercise, therapeutic activity, neuromuscular re-education, manual therapy, passive range of motion, splinting, electrical stimulation, paraffin, patient/family education, and DME and/or AE instructions  RECOMMENDED OTHER SERVICES: follow up with MD regarding sleep  CONSULTED AND AGREED WITH PLAN OF CARE: Patient  PLAN FOR NEXT SESSION: Follow up on HEP,  wrist strengthening using low weight, grip and pinch strengthening, coordination tasks   Guadelupe Sabin, OTR/L  [phone] 09/04/2022, 11:15 AM

## 2022-09-06 ENCOUNTER — Encounter (HOSPITAL_COMMUNITY): Payer: BLUE CROSS/BLUE SHIELD | Admitting: Occupational Therapy

## 2022-09-11 ENCOUNTER — Encounter (HOSPITAL_COMMUNITY): Payer: Self-pay | Admitting: Occupational Therapy

## 2022-09-11 ENCOUNTER — Ambulatory Visit (HOSPITAL_COMMUNITY): Payer: BLUE CROSS/BLUE SHIELD | Admitting: Occupational Therapy

## 2022-09-11 DIAGNOSIS — M25532 Pain in left wrist: Secondary | ICD-10-CM | POA: Diagnosis not present

## 2022-09-11 DIAGNOSIS — R29898 Other symptoms and signs involving the musculoskeletal system: Secondary | ICD-10-CM

## 2022-09-11 DIAGNOSIS — M25632 Stiffness of left wrist, not elsewhere classified: Secondary | ICD-10-CM

## 2022-09-11 DIAGNOSIS — R29818 Other symptoms and signs involving the nervous system: Secondary | ICD-10-CM

## 2022-09-11 NOTE — Therapy (Signed)
OUTPATIENT OCCUPATIONAL THERAPY ORTHO REASSESSMENT & TREATMENT NOTE RECERTIFICATION   Patient Name: Dalton Guerrero MRN: [id number] DOB:1960/12/08, 62 y.o., male Today's Date: 09/11/2022  PCP: Dr. Modena Morrow REFERRING PROVIDER: [name], PA-C  END OF SESSION:  OT End of Session - 09/11/22 1113     Visit Number 12    Number of Visits 20    Date for OT Re-Evaluation 10/15/22    Authorization Type [employer]    OT Start Time 1036    OT Stop Time 1115    OT Time Calculation (min) 39 min    Activity Tolerance Patient tolerated treatment well    Behavior During Therapy WFL for tasks assessed/performed               Past Medical History:  Diagnosis Date   Hypertension    Past Surgical History:  Procedure Laterality Date   KIDNEY SURGERY Bilateral    Pt. 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   ONSET DATE: 02/28/22  REFERRING DIAG: right RC tear, left radial nerve palsy, left wrist contracture  THERAPY DIAG:  Pain in left wrist - Plan: Ot plan of care cert/re-cert  Stiffness of left wrist, not elsewhere classified - Plan: Ot plan of care cert/re-cert  Other symptoms and signs involving the nervous system - Plan: Ot plan of care cert/re-cert  Other symptoms and signs involving the musculoskeletal system - Plan: Ot plan of care cert/re-cert  Rationale for Evaluation and Treatment: Rehabilitation  SUBJECTIVE:   SUBJECTIVE STATEMENT: S: "I did exercises late yesterday."   PERTINENT HISTORY: Pt is a 62 y/o male presenting with right RC tear, left radial nerve palsy, and left wrist contracture. Pt presents in a standard prefabricated wrist splint today, minimal benefit to wrist and left hand. Pt fell several years ago landing on his right shoulder, then had surgery. In March, pt fell at work when tripping over a mat, then later tried to lift a box overhead and sustained a RC tear. Pt was scheduled for a reverse TSA, but  has postponed due to the severity of deficits with his left hand and wrist. Pt has had a nerve conduction study completed, pt reports he was told that it would be 6 months before he saw improvement. Currently, the pt's primary concern is his left hand and wrist, this is where he would like therapy to focus.   PRECAUTIONS: None  WEIGHT BEARING RESTRICTIONS: No  PAIN:  Are you having pain? Yes: NPRS scale: 7/10 Pain location: right shoulder Pain description: aching, sore Aggravating factors: movement, use, lifting Relieving factors: nothing  FALLS: Has patient fallen in last 6 months? Yes. Number of falls 2  PATIENT GOALS: To get his left hand working again.   NEXT MD VISIT: unsure  OBJECTIVE:   HAND DOMINANCE: Left  ADLs: Overall ADLs: Pt reports he can wash the left side of his body and he can lift a lightweight milk jug with his left hand, otherwise he is unable to use his left hand functionally. He has been soaking his hand in epsom salts and hot water to stretch it.   FUNCTIONAL OUTCOME MEASURES: Quick Dash: 81.82 Quick Dash: (09/04/22): 59.09  UPPER EXTREMITY ROM:     Active ROM Left eval Left 08/31/22 Left 09/11/22  Elbow flexion 130 140   Elbow extension 0 0   Wrist flexion 50 51 50  Wrist extension 0 16 17  Wrist ulnar deviation 0 24 38  Wrist radial deviation 0 6 15  Wrist pronation 45 WFL 90  Wrist supination 90 WFL 90  (Blank rows = not tested)  Active ROM Left eval Left 08/31/22 Left 09/11/22  Thumb MCP (0-60) 72 65   Thumb IP (0-80) 21 50   Thumb Opposition to Small Finger  0 Able   Index MCP (0-90)  72 65   Index PIP (0-100)  62 95   Index DIP (0-70)  30 60   Long MCP (0-90)  68 75   Long PIP (0-100)  62 85   Long DIP (0-70)  55 55   Ring MCP (0-90)  58 85   Ring PIP (0-100)  82 85   Ring DIP (0-70)  40 55   Little MCP (0-90)  74 85   Little PIP (0-100)  58 80   Little DIP (0-70)  45 55   (Blank rows = not tested)    -09/11/22: Pt is able to  make a full fist now.   UPPER EXTREMITY MMT:     MMT Left eval Left 08/31/22 Left 09/11/22  Elbow flexion 5/5 5/5   Elbow extension 5/5 5/5   Wrist flexion 3-/5 4+/5 4+/5  Wrist extension 1/5 3/5 4-/5  Wrist ulnar deviation 0/5 4-/5 4-/5  Wrist radial deviation 0/5 3+/5 4-/5  Wrist pronation 2/5 3+/5 5/5  Wrist supination 3-/5 4/5 5/5  (Blank rows = not tested)  HAND FUNCTION: Grip strength: Right: 68 lbs; Left: 10 lbs, Lateral pinch: Right: 13 lbs, Left: 6 lbs, and 3 point pinch: Right: 14 lbs, Left: 1 lbs Grip strength: (08/31/22)Left: 20 lbs, Lateral pinch: Left: 8 lbs, and 3 point pinch: Left: 9 lbs Grip strength: (09/11/22)Left: 24 lbs, Lateral pinch: Left: 9 lbs, and 3 point pinch: Left: 9 lbs  COORDINATION: Unable to complete due to deficits 08/31/22: 9 Hole Peg Test: Right: 23.15 sec; Left: 41.42 sec 09/11/22: 9 Hole Peg Test: Left: 29.04" sec  SENSATION: Light touch: WFL  EDEMA: None noted               08/31/22: mild swelling along the dorsal aspect of the hand  COGNITION: Overall cognitive status: Difficulty to assess due to: lack of sleep, emotional, tangential   TODAY'S TREATMENT:                                                                                                                              DATE:  09/11/22 -Wrist self-stretch: extension, 2x20" holds, elbow tucked by side -A/ROM: wrist extension, wrist propped on towel roll  -Wrist strengthening: wrist flexion, ulnar/radial deviation, x10, wrist propped on towel roll  -Hand gripper: large beads hand gripper horizontal at 20#, medium beads gripper horizontal at 20#-fatigue noted at end of task with wrist flexion prominent during grasp and place -Finger taps: Pt working on lifting each digit into extension individually, max difficulty, 2 trials.  09/04/22 -A/ROM: wrist flexion/extension, ulnar/radial deviation, supination/pronation, x10, wrist propped  on towel roll  -Hand gripper: large beads hand gripper  vertical at 15#, medium beads gripper vertical at 20#-fatigue noted at end of task with wrist flexion prominent during grasp and place -Wrist self-stretch: extension, 2x20" holds, one at door and one on table -Fine Motor Coordination: pt completing grooved pegboard, holding 5 pegs at a time and working on palm to fingertip translation to grasp and place pegs in pegboard using the left hand, increased time -Pinch strengthening: pt using red clothespin and 3 point pinch to grasp and stack 3 piles of  5 sponges  08/31/22 -P/ROM: wrist flexion/extension, ulnar/radial deviation, supination/pronation, digit composite flexion, x10 with 3-5" holds in end ranges -A/ROM: wrist flexion/extension, ulnar/radial deviation, supination/pronation, x10, wrist propped on bolster -Digit ROM:  flexion/extension, abduction/adduction, opposition, x10 -Measurements for Progress note -9 hole peg test -Paraffin Bath: dipped x3 with hand in composite flexion, wrapped in moist heat for 8 mins. Completed to assist with pain relief, as well as stiffness in all digits of the LUE. Pt tolerated well and reported that it has seemed to help ease/relax his hand more.    PATIENT EDUCATION: Education details: Reviewed HEP, problem solved tucking right arm under left forearm for solid surface Person educated: Patient Education method: Explanation, Demonstration, and Handouts Education comprehension: verbalized understanding and returned demonstration  HOME EXERCISE PROGRAM: Eval: weightbearing 1/5: Radial Nerve Tendon Glide 1/19: Digit ROM 1/30: weighted wrist stretches 2/1: Theraputty Exercise 2/8: Wrist A/ROM    GOALS: Goals reviewed with patient? Yes  SHORT TERM GOALS: Target date: 08/08/22  Pt will be provided with and educated on HEP to improve mobility in LUE required for use during ADL completion.   Goal status: MET  2.  Pt will be provided with and educated on splint wear and care for radial nerve palsy,  demonstrating independence in donning and doffing.   Goal status: MET  3.  Pt will increase left wrist strength to 3+/5 to improve ability to reach for items using his left hand and maintain a light grasp.   Goal status: MET  4.  Pt will increase left grip strength by 5# and pinch strength by 2# to improve ability to grasp and maintain hold on tools for ADL completion such as a cup, toothbrush, clothing, etc.   Goal status: MET    LONG TERM GOALS: Target date: 09/09/22  Pt will decrease pain in LUE to 3/10 or less to improve ability to sleep for 2+ consecutive hours without waking due to pain.   Goal status: IN PROGRESS  2.  Pt will decrease LUE fascial restrictions to min amounts or less to improve mobility required for functional reaching tasks.   Goal status: IN PROGRESS  3.  Pt will increase LUE A/ROM by at least 25 degrees to improve ability to use LUE when reaching overhead or behind back during dressing and bathing tasks.   Goal status: IN PROGRESS  4.  Pt will increase LUE strength to 4/5 or greater to improve ability to use LUE when lifting or carrying items during meal preparation/housework/yardwork tasks.   Goal status: IN PROGRESS  5.  Pt will return to highest level of function using LUE as dominant during functional task completion.   Goal status: IN PROGRESS      ASSESSMENT:  CLINICAL IMPRESSION: Reassessment completed this session, pt reports he has been completing his stretches and HEPs. He is frustrated with the slow progress of his wrist extension strength. Discussed progress of nerve healing and encouraged pt  that he is making good progress. He has met all STGs and is progressing towards his LTGs. He is now able to make a full fist and extend his fingers, he has good strength in his wrist and continues to progress. Pt is able to carry water to his dogs via jugs now. Continued working on progressing wrist and grip strength this session. Pt will continue to  benefit from skilled OT services to progress functional use of LUE and improve strength and coordination required for successful task completion.   PERFORMANCE DEFICITS: in functional skills including ADLs, IADLs, coordination, dexterity, proprioception, sensation, edema, tone, ROM, strength, pain, fascial restrictions, Fine motor control, endurance, and UE functional use, cognitive skills including attention, emotional, and thought, and psychosocial skills including coping strategies.     PLAN:  OT FREQUENCY: 2x/week  OT DURATION: 4 weeks  PLANNED INTERVENTIONS: self care/ADL training, therapeutic exercise, therapeutic activity, neuromuscular re-education, manual therapy, passive range of motion, splinting, electrical stimulation, paraffin, patient/family education, and DME and/or AE instructions  RECOMMENDED OTHER SERVICES: follow up with MD regarding sleep  CONSULTED AND AGREED WITH PLAN OF CARE: Patient  PLAN FOR NEXT SESSION: Follow up on HEP,  wrist strengthening using low weight, grip and pinch strengthening, coordination tasks   Guadelupe Sabin, OTR/L  [phone] 09/11/2022, 11:16 AM

## 2022-09-13 ENCOUNTER — Encounter (HOSPITAL_COMMUNITY): Payer: Self-pay | Admitting: Occupational Therapy

## 2022-09-13 ENCOUNTER — Ambulatory Visit (HOSPITAL_COMMUNITY): Payer: BLUE CROSS/BLUE SHIELD | Admitting: Occupational Therapy

## 2022-09-13 DIAGNOSIS — R29818 Other symptoms and signs involving the nervous system: Secondary | ICD-10-CM

## 2022-09-13 DIAGNOSIS — M25532 Pain in left wrist: Secondary | ICD-10-CM | POA: Diagnosis not present

## 2022-09-13 DIAGNOSIS — M25632 Stiffness of left wrist, not elsewhere classified: Secondary | ICD-10-CM

## 2022-09-13 NOTE — Therapy (Signed)
OUTPATIENT OCCUPATIONAL THERAPY ORTHO TREATMENT NOTE  Patient Name: Dalton Guerrero MRN: [id number] DOB:12-Nov-1960, 62 y.o., male Today's Date: 09/13/2022  PCP: Dr. Modena Morrow REFERRING PROVIDER: [name], PA-C  END OF SESSION:  OT End of Session - 09/13/22 0904     Visit Number 13    Number of Visits 20    Date for OT Re-Evaluation 10/15/22    Authorization Type [employer]    OT Start Time 0905    OT Stop Time 0945    OT Time Calculation (min) 40 min    Activity Tolerance Patient tolerated treatment well    Behavior During Therapy WFL for tasks assessed/performed               Past Medical History:  Diagnosis Date   Hypertension    Past Surgical History:  Procedure Laterality Date   KIDNEY SURGERY Bilateral    Pt. 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   ONSET DATE: 02/28/22  REFERRING DIAG: right RC tear, left radial nerve palsy, left wrist contracture  THERAPY DIAG:  Pain in left wrist  Stiffness of left wrist, not elsewhere classified  Other symptoms and signs involving the nervous system  Rationale for Evaluation and Treatment: Rehabilitation  SUBJECTIVE:   SUBJECTIVE STATEMENT: S: "My L elbow is affecting my therapy, it is still causing me pain."   PERTINENT HISTORY: Pt is a 62 y/o male presenting with right RC tear, left radial nerve palsy, and left wrist contracture. Pt presents in a standard prefabricated wrist splint today, minimal benefit to wrist and left hand. Pt fell several years ago landing on his right shoulder, then had surgery. In March, pt fell at work when tripping over a mat, then later tried to lift a box overhead and sustained a RC tear. Pt was scheduled for a reverse TSA, but has postponed due to the severity of deficits with his left hand and wrist. Pt has had a nerve conduction study completed, pt reports he was told that it would be 6 months before he saw improvement. Currently, the  pt's primary concern is his left hand and wrist, this is where he would like therapy to focus.   PRECAUTIONS: None  WEIGHT BEARING RESTRICTIONS: No  PAIN:  Are you having pain? Yes: NPRS scale: 7/10 Pain location: right shoulder and L wrist Pain description: aching, sore Aggravating factors: movement, use, lifting Relieving factors: nothing  FALLS: Has patient fallen in last 6 months? Yes. Number of falls 2  PATIENT GOALS: To get his left hand working again.   NEXT MD VISIT: unsure  OBJECTIVE:   HAND DOMINANCE: Left  ADLs: Overall ADLs: Pt reports he can wash the left side of his body and he can lift a lightweight milk jug with his left hand, otherwise he is unable to use his left hand functionally. He has been soaking his hand in epsom salts and hot water to stretch it.   FUNCTIONAL OUTCOME MEASURES: Quick Dash: 81.82 Quick Dash: (09/04/22): 59.09  UPPER EXTREMITY ROM:     Active ROM Left eval Left 08/31/22 Left 09/11/22  Elbow flexion 130 140   Elbow extension 0 0   Wrist flexion 50 51 50  Wrist extension 0 16 17  Wrist ulnar deviation 0 24 38  Wrist radial deviation 0 6 15  Wrist pronation 45 WFL 90  Wrist supination 90 WFL 90  (Blank rows = not tested)  Active ROM Left eval  Left 08/31/22 Left 09/11/22  Thumb MCP (0-60) 72 65   Thumb IP (0-80) 21 50   Thumb Opposition to Small Finger  0 Able   Index MCP (0-90)  72 65   Index PIP (0-100)  62 95   Index DIP (0-70)  30 60   Long MCP (0-90)  68 75   Long PIP (0-100)  62 85   Long DIP (0-70)  55 55   Ring MCP (0-90)  58 85   Ring PIP (0-100)  82 85   Ring DIP (0-70)  40 55   Little MCP (0-90)  74 85   Little PIP (0-100)  58 80   Little DIP (0-70)  45 55   (Blank rows = not tested)    -09/11/22: Pt is able to make a full fist now.   UPPER EXTREMITY MMT:     MMT Left eval Left 08/31/22 Left 09/11/22  Elbow flexion 5/5 5/5   Elbow extension 5/5 5/5   Wrist flexion 3-/5 4+/5 4+/5  Wrist extension 1/5  3/5 4-/5  Wrist ulnar deviation 0/5 4-/5 4-/5  Wrist radial deviation 0/5 3+/5 4-/5  Wrist pronation 2/5 3+/5 5/5  Wrist supination 3-/5 4/5 5/5  (Blank rows = not tested)  HAND FUNCTION: Grip strength: Right: 68 lbs; Left: 10 lbs, Lateral pinch: Right: 13 lbs, Left: 6 lbs, and 3 point pinch: Right: 14 lbs, Left: 1 lbs Grip strength: (08/31/22)Left: 20 lbs, Lateral pinch: Left: 8 lbs, and 3 point pinch: Left: 9 lbs Grip strength: (09/11/22)Left: 24 lbs, Lateral pinch: Left: 9 lbs, and 3 point pinch: Left: 9 lbs  COORDINATION: Unable to complete due to deficits 08/31/22: 9 Hole Peg Test: Right: 23.15 sec; Left: 41.42 sec 09/11/22: 9 Hole Peg Test: Left: 29.04" sec  SENSATION: Light touch: WFL  EDEMA: None noted               08/31/22: mild swelling along the dorsal aspect of the hand  COGNITION: Overall cognitive status: Difficulty to assess due to: lack of sleep, emotional, tangential   TODAY'S TREATMENT:                                                                                                                              DATE:  09/13/22 -Wrist self-stretch: extension, 3x20" holds, elbow tucked by side -A/ROM: wrist flexion/extension, ulnar/radial Deviation, supination/pronation, x10 -Gripper: 20lb stacking 6 cubes, 22lbs stacking 6 cubes (max difficulty), 25lbs full squeezes x10 -Digit ROM: abduction/adduction, composite flexion, digit flicking, finger tapping with support at the forearm, x10 each -Wrist Strengthening: on foam roll, 2lb dumbbell, flexion/extension, ulnar/radial deviation, supination/pronation, x10   09/11/22 -Wrist self-stretch: extension, 2x20" holds, elbow tucked by side -A/ROM: wrist extension, wrist propped on towel roll  -Wrist strengthening: wrist flexion, ulnar/radial deviation, x10, wrist propped on towel roll  -Hand gripper: large beads hand gripper horizontal at 20#, medium beads gripper horizontal at 20#-fatigue noted at end of task with  wrist  flexion prominent during grasp and place -Finger taps: Pt working on lifting each digit into extension individually, max difficulty, 2 trials.  09/04/22 -A/ROM: wrist flexion/extension, ulnar/radial deviation, supination/pronation, x10, wrist propped on towel roll  -Hand gripper: large beads hand gripper vertical at 15#, medium beads gripper vertical at 20#-fatigue noted at end of task with wrist flexion prominent during grasp and place -Wrist self-stretch: extension, 2x20" holds, one at door and one on table -Fine Motor Coordination: pt completing grooved pegboard, holding 5 pegs at a time and working on palm to fingertip translation to grasp and place pegs in pegboard using the left hand, increased time -Pinch strengthening: pt using red clothespin and 3 point pinch to grasp and stack 3 piles of  5 sponges   PATIENT EDUCATION: Education details: Audiological scientist balls with each digit Person educated: Patient Education method: Consulting civil engineer, Demonstration, and Handouts Education comprehension: verbalized understanding and returned demonstration  HOME EXERCISE PROGRAM: Eval: weightbearing 1/5: Radial Nerve Tendon Glide 1/19: Digit ROM 1/30: weighted wrist stretches 2/1: Theraputty Exercise 2/8: Wrist A/ROM [id number]: Flicking cotton balls    GOALS: Goals reviewed with patient? Yes  SHORT TERM GOALS: Target date: 08/08/22  Pt will be provided with and educated on HEP to improve mobility in LUE required for use during ADL completion.   Goal status: MET  2.  Pt will be provided with and educated on splint wear and care for radial nerve palsy, demonstrating independence in donning and doffing.   Goal status: MET  3.  Pt will increase left wrist strength to 3+/5 to improve ability to reach for items using his left hand and maintain a light grasp.   Goal status: MET  4.  Pt will increase left grip strength by 5# and pinch strength by 2# to improve ability to grasp and maintain hold on  tools for ADL completion such as a cup, toothbrush, clothing, etc.   Goal status: MET    LONG TERM GOALS: Target date: 09/09/22  Pt will decrease pain in LUE to 3/10 or less to improve ability to sleep for 2+ consecutive hours without waking due to pain.   Goal status: IN PROGRESS  2.  Pt will decrease LUE fascial restrictions to min amounts or less to improve mobility required for functional reaching tasks.   Goal status: IN PROGRESS  3.  Pt will increase LUE A/ROM by at least 25 degrees to improve ability to use LUE when reaching overhead or behind back during dressing and bathing tasks.   Goal status: IN PROGRESS  4.  Pt will increase LUE strength to 4/5 or greater to improve ability to use LUE when lifting or carrying items during meal preparation/housework/yardwork tasks.   Goal status: IN PROGRESS  5.  Pt will return to highest level of function using LUE as dominant during functional task completion.   Goal status: IN PROGRESS      ASSESSMENT:  CLINICAL IMPRESSION: Pt presenting to this session with improved activity tolerance, however continued high pain levels. He appears to be making steady progress with wrist extension and ulnar/radial deviation ROM, as well as overall grip and wrist strength. Pt requiring multiple short rest breaks throughout session as pain was increasing with exercises. OT providing support along the elbow and forearm throughout the session to limit compensatory movements involving the elbow. Pt reporting severe pain in the L elbow with weight bearing, as well as exercises that require him to fully extend through the elbow. OT advised pt to  limit weight through elbow, as well as maintaining at least a mild bend in the elbow at all times to reduce strain. Verbal and tactile cuing provided for positioning and technique.    PERFORMANCE DEFICITS: in functional skills including ADLs, IADLs, coordination, dexterity, proprioception, sensation, edema, tone,  ROM, strength, pain, fascial restrictions, Fine motor control, endurance, and UE functional use, cognitive skills including attention, emotional, and thought, and psychosocial skills including coping strategies.     PLAN:  OT FREQUENCY: 2x/week  OT DURATION: 4 weeks  PLANNED INTERVENTIONS: self care/ADL training, therapeutic exercise, therapeutic activity, neuromuscular re-education, manual therapy, passive range of motion, splinting, electrical stimulation, paraffin, patient/family education, and DME and/or AE instructions  RECOMMENDED OTHER SERVICES: follow up with MD regarding sleep  CONSULTED AND AGREED WITH PLAN OF CARE: Patient  PLAN FOR NEXT SESSION: Follow up on HEP,  wrist strengthening using low weight, grip and pinch strengthening, coordination tasks   Paulita Fujita, OTR/L  [phone] 09/13/2022, 9:10 AM

## 2022-09-22 ENCOUNTER — Encounter (HOSPITAL_COMMUNITY): Payer: Self-pay | Admitting: Occupational Therapy

## 2022-09-22 ENCOUNTER — Ambulatory Visit (HOSPITAL_COMMUNITY): Payer: BLUE CROSS/BLUE SHIELD | Attending: Family Medicine | Admitting: Occupational Therapy

## 2022-09-22 DIAGNOSIS — R29818 Other symptoms and signs involving the nervous system: Secondary | ICD-10-CM | POA: Insufficient documentation

## 2022-09-22 DIAGNOSIS — M25632 Stiffness of left wrist, not elsewhere classified: Secondary | ICD-10-CM | POA: Insufficient documentation

## 2022-09-22 DIAGNOSIS — M25532 Pain in left wrist: Secondary | ICD-10-CM | POA: Diagnosis present

## 2022-09-22 NOTE — Therapy (Unsigned)
OUTPATIENT OCCUPATIONAL THERAPY ORTHO TREATMENT NOTE  Patient Name: Dalton Guerrero MRN: [id number] DOB:[birth year], 62 y.o., male Today's Date: 09/22/2022  PCP: Dr. Modena Morrow REFERRING PROVIDER: [name], PA-C  END OF SESSION:  OT End of Session - 09/22/22 0917     Visit Number 14    Number of Visits 20    Date for OT Re-Evaluation 10/15/22    Authorization Type [employer]    Progress Note Due on Visit 20    OT Start Time 0910    OT Stop Time 0948    OT Time Calculation (min) 38 min    Activity Tolerance Patient tolerated treatment well    Behavior During Therapy WFL for tasks assessed/performed               Past Medical History:  Diagnosis Date   Hypertension    Past Surgical History:  Procedure Laterality Date   KIDNEY SURGERY Bilateral    Pt. 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   ONSET DATE: 02/28/22  REFERRING DIAG: right RC tear, left radial nerve palsy, left wrist contracture  THERAPY DIAG:  Pain in left wrist  Stiffness of left wrist, not elsewhere classified  Other symptoms and signs involving the nervous system  Rationale for Evaluation and Treatment: Rehabilitation  SUBJECTIVE:   SUBJECTIVE STATEMENT: S: "My L elbow is affecting my therapy, it is still causing me pain."   PERTINENT HISTORY: Pt is a 62 y/o male presenting with right RC tear, left radial nerve palsy, and left wrist contracture. Pt presents in a standard prefabricated wrist splint today, minimal benefit to wrist and left hand. Pt fell several years ago landing on his right shoulder, then had surgery. In March, pt fell at work when tripping over a mat, then later tried to lift a box overhead and sustained a RC tear. Pt was scheduled for a reverse TSA, but has postponed due to the severity of deficits with his left hand and wrist. Pt has had a nerve conduction study completed, pt reports he was told that it would be 6 months before he  saw improvement. Currently, the pt's primary concern is his left hand and wrist, this is where he would like therapy to focus.   PRECAUTIONS: None  WEIGHT BEARING RESTRICTIONS: No  PAIN:  Are you having pain? Yes: NPRS scale: 7/10 Pain location: right shoulder and L wrist Pain description: aching, sore Aggravating factors: movement, use, lifting Relieving factors: nothing  FALLS: Has patient fallen in last 6 months? Yes. Number of falls 2  PATIENT GOALS: To get his left hand working again.   NEXT MD VISIT: unsure  OBJECTIVE:   HAND DOMINANCE: Left  ADLs: Overall ADLs: Pt reports he can wash the left side of his body and he can lift a lightweight milk jug with his left hand, otherwise he is unable to use his left hand functionally. He has been soaking his hand in epsom salts and hot water to stretch it.   FUNCTIONAL OUTCOME MEASURES: Quick Dash: 81.82 Quick Dash: (09/04/22): 59.09  UPPER EXTREMITY ROM:     Active ROM Left eval Left 08/31/22 Left 09/11/22  Elbow flexion 130 140   Elbow extension 0 0   Wrist flexion 50 51 50  Wrist extension 0 16 17  Wrist ulnar deviation 0 24 38  Wrist radial deviation 0 6 15  Wrist pronation 45 WFL 90  Wrist supination 90 WFL 90  (Blank  rows = not tested)  Active ROM Left eval Left 08/31/22 Left 09/11/22  Thumb MCP (0-60) 72 65   Thumb IP (0-80) 21 50   Thumb Opposition to Small Finger  0 Able   Index MCP (0-90)  72 65   Index PIP (0-100)  62 95   Index DIP (0-70)  30 60   Long MCP (0-90)  68 75   Long PIP (0-100)  62 85   Long DIP (0-70)  55 55   Ring MCP (0-90)  58 85   Ring PIP (0-100)  82 85   Ring DIP (0-70)  40 55   Little MCP (0-90)  74 85   Little PIP (0-100)  58 80   Little DIP (0-70)  45 55   (Blank rows = not tested)    -09/11/22: Pt is able to make a full fist now.   UPPER EXTREMITY MMT:     MMT Left eval Left 08/31/22 Left 09/11/22  Elbow flexion 5/5 5/5   Elbow extension 5/5 5/5   Wrist flexion 3-/5  4+/5 4+/5  Wrist extension 1/5 3/5 4-/5  Wrist ulnar deviation 0/5 4-/5 4-/5  Wrist radial deviation 0/5 3+/5 4-/5  Wrist pronation 2/5 3+/5 5/5  Wrist supination 3-/5 4/5 5/5  (Blank rows = not tested)  HAND FUNCTION: Grip strength: Right: 68 lbs; Left: 10 lbs, Lateral pinch: Right: 13 lbs, Left: 6 lbs, and 3 point pinch: Right: 14 lbs, Left: 1 lbs Grip strength: (08/31/22)Left: 20 lbs, Lateral pinch: Left: 8 lbs, and 3 point pinch: Left: 9 lbs Grip strength: (09/11/22)Left: 24 lbs, Lateral pinch: Left: 9 lbs, and 3 point pinch: Left: 9 lbs  COORDINATION: Unable to complete due to deficits 08/31/22: 9 Hole Peg Test: Right: 23.15 sec; Left: 41.42 sec 09/11/22: 9 Hole Peg Test: Left: 29.04" sec  SENSATION: Light touch: WFL  EDEMA: None noted               08/31/22: mild swelling along the dorsal aspect of the hand  COGNITION: Overall cognitive status: Difficulty to assess due to: lack of sleep, emotional, tangential   TODAY'S TREATMENT:                                                                                                                              DATE:  09/22/22 -A/ROM: wrist flexion/extension, ulnar/radial Deviation, supination/pronation, x10 -Digit ROM: abduction/adduction, composite flexion, digit flicking, finger tapping with support at the forearm, x10 each -Wrist Strengthening: on foam roll, 2lb dumbbell, flexion/extension, ulnar/radial deviation, supination/pronation, x10  -Theraputty: red, roll into a ball, flatten into pancake, pull apart into a log, tip pinch x8, full squeeze x10 -Coins, Picking up 10 coins and placing in a piggy bank  09/13/22 -Wrist self-stretch: extension, 3x20" holds, elbow tucked by side -A/ROM: wrist flexion/extension, ulnar/radial Deviation, supination/pronation, x10 -Gripper: 20lb stacking 6 cubes, 22lbs stacking 6 cubes (max difficulty), 25lbs full squeezes x10 -Digit ROM: abduction/adduction, composite flexion,  digit flicking, finger  tapping with support at the forearm, x10 each -Wrist Strengthening: on foam roll, 2lb dumbbell, flexion/extension, ulnar/radial deviation, supination/pronation, x10   09/11/22 -Wrist self-stretch: extension, 2x20" holds, elbow tucked by side -A/ROM: wrist extension, wrist propped on towel roll  -Wrist strengthening: wrist flexion, ulnar/radial deviation, x10, wrist propped on towel roll  -Hand gripper: large beads hand gripper horizontal at 20#, medium beads gripper horizontal at 20#-fatigue noted at end of task with wrist flexion prominent during grasp and place -Finger taps: Pt working on lifting each digit into extension individually, max difficulty, 2 trials.    PATIENT EDUCATION: Education details: Coin exercises Person educated: Patient Education method: Explanation, Demonstration, and Handouts Education comprehension: verbalized understanding and returned demonstration  HOME EXERCISE PROGRAM: Eval: weightbearing 1/5: Radial Nerve Tendon Glide 1/19: Digit ROM 1/30: weighted wrist stretches 2/1: Theraputty Exercise 2/8: Wrist A/ROM [id number]: Flicking cotton balls 3/8: Coin Exercises    GOALS: Goals reviewed with patient? Yes  SHORT TERM GOALS: Target date: 08/08/22  Pt will be provided with and educated on HEP to improve mobility in LUE required for use during ADL completion.   Goal status: MET  2.  Pt will be provided with and educated on splint wear and care for radial nerve palsy, demonstrating independence in donning and doffing.   Goal status: MET  3.  Pt will increase left wrist strength to 3+/5 to improve ability to reach for items using his left hand and maintain a light grasp.   Goal status: MET  4.  Pt will increase left grip strength by 5# and pinch strength by 2# to improve ability to grasp and maintain hold on tools for ADL completion such as a cup, toothbrush, clothing, etc.   Goal status: MET    LONG TERM GOALS: Target date: 09/09/22  Pt will  decrease pain in LUE to 3/10 or less to improve ability to sleep for 2+ consecutive hours without waking due to pain.   Goal status: IN PROGRESS  2.  Pt will decrease LUE fascial restrictions to min amounts or less to improve mobility required for functional reaching tasks.   Goal status: IN PROGRESS  3.  Pt will increase LUE A/ROM by at least 25 degrees to improve ability to use LUE when reaching overhead or behind back during dressing and bathing tasks.   Goal status: IN PROGRESS  4.  Pt will increase LUE strength to 4/5 or greater to improve ability to use LUE when lifting or carrying items during meal preparation/housework/yardwork tasks.   Goal status: IN PROGRESS  5.  Pt will return to highest level of function using LUE as dominant during functional task completion.   Goal status: IN PROGRESS      ASSESSMENT:  CLINICAL IMPRESSION: Pt presenting to this session with improved activity tolerance, however continued high pain levels. He appears to be making steady progress with wrist extension and ulnar/radial deviation ROM, as well as overall grip and wrist strength. Pt requiring multiple short rest breaks throughout session as pain was increasing with exercises. OT providing support along the elbow and forearm throughout the session to limit compensatory movements involving the elbow. Pt reporting severe pain in the L elbow with weight bearing, as well as exercises that require him to fully extend through the elbow. OT advised pt to limit weight through elbow, as well as maintaining at least a mild bend in the elbow at all times to reduce strain. Verbal and tactile cuing provided for positioning and  technique.    PERFORMANCE DEFICITS: in functional skills including ADLs, IADLs, coordination, dexterity, proprioception, sensation, edema, tone, ROM, strength, pain, fascial restrictions, Fine motor control, endurance, and UE functional use, cognitive skills including attention,  emotional, and thought, and psychosocial skills including coping strategies.     PLAN:  OT FREQUENCY: 2x/week  OT DURATION: 4 weeks  PLANNED INTERVENTIONS: self care/ADL training, therapeutic exercise, therapeutic activity, neuromuscular re-education, manual therapy, passive range of motion, splinting, electrical stimulation, paraffin, patient/family education, and DME and/or AE instructions  RECOMMENDED OTHER SERVICES: follow up with MD regarding sleep  CONSULTED AND AGREED WITH PLAN OF CARE: Patient  PLAN FOR NEXT SESSION: Follow up on HEP,  wrist strengthening using low weight, grip and pinch strengthening, coordination tasks   Paulita Fujita, OTR/L  [phone] 09/22/2022, 9:18 AM

## 2022-09-25 ENCOUNTER — Ambulatory Visit (HOSPITAL_COMMUNITY): Payer: BLUE CROSS/BLUE SHIELD | Admitting: Occupational Therapy

## 2022-09-25 DIAGNOSIS — R29818 Other symptoms and signs involving the nervous system: Secondary | ICD-10-CM

## 2022-09-25 DIAGNOSIS — M25532 Pain in left wrist: Secondary | ICD-10-CM

## 2022-09-25 DIAGNOSIS — M25632 Stiffness of left wrist, not elsewhere classified: Secondary | ICD-10-CM

## 2022-09-25 NOTE — Therapy (Signed)
OUTPATIENT OCCUPATIONAL THERAPY ORTHO TREATMENT NOTE  Patient Name: Dalton Guerrero MRN: [id number] DOB:Jan 16, 1961, 62 y.o., male Today's Date: 09/25/2022  PCP: Dr. Modena Morrow REFERRING PROVIDER: [name], PA-C  END OF SESSION:  OT End of Session - 09/25/22 1113     Visit Number 15    Number of Visits 20    Date for OT Re-Evaluation 10/15/22    Authorization Type [employer]    Progress Note Due on Visit 20    OT Start Time 1035    OT Stop Time 1114    OT Time Calculation (min) 39 min    Activity Tolerance Patient tolerated treatment well    Behavior During Therapy WFL for tasks assessed/performed                Past Medical History:  Diagnosis Date   Hypertension    Past Surgical History:  Procedure Laterality Date   KIDNEY SURGERY Bilateral    Pt. 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   ONSET DATE: 02/28/22  REFERRING DIAG: right RC tear, left radial nerve palsy, left wrist contracture  THERAPY DIAG:  Pain in left wrist  Stiffness of left wrist, not elsewhere classified  Other symptoms and signs involving the nervous system  Rationale for Evaluation and Treatment: Rehabilitation  SUBJECTIVE:   SUBJECTIVE STATEMENT: S: "The doctor sent me to a surgeon who is going to do decompression surgery next month."   PERTINENT HISTORY: Pt is a 62 y/o male presenting with right RC tear, left radial nerve palsy, and left wrist contracture. Pt presents in a standard prefabricated wrist splint today, minimal benefit to wrist and left hand. Pt fell several years ago landing on his right shoulder, then had surgery. In March, pt fell at work when tripping over a mat, then later tried to lift a box overhead and sustained a RC tear. Pt was scheduled for a reverse TSA, but has postponed due to the severity of deficits with his left hand and wrist. Pt has had a nerve conduction study completed, pt reports he was told that it would  be 6 months before he saw improvement. Currently, the pt's primary concern is his left hand and wrist, this is where he would like therapy to focus.   PRECAUTIONS: None  WEIGHT BEARING RESTRICTIONS: No  PAIN:  Are you having pain? Yes: NPRS scale: 7/10 Pain location: right shoulder and L wrist Pain description: aching, sore Aggravating factors: movement, use, lifting Relieving factors: nothing  FALLS: Has patient fallen in last 6 months? Yes. Number of falls 2  PATIENT GOALS: To get his left hand working again.   NEXT MD VISIT: unsure  OBJECTIVE:   HAND DOMINANCE: Left  ADLs: Overall ADLs: Pt reports he can wash the left side of his body and he can lift a lightweight milk jug with his left hand, otherwise he is unable to use his left hand functionally. He has been soaking his hand in epsom salts and hot water to stretch it.   FUNCTIONAL OUTCOME MEASURES: Quick Dash: 81.82 Quick Dash: (09/04/22): 59.09  UPPER EXTREMITY ROM:     Active ROM Left eval Left 08/31/22 Left 09/11/22  Elbow flexion 130 140   Elbow extension 0 0   Wrist flexion 50 51 50  Wrist extension 0 16 17  Wrist ulnar deviation 0 24 38  Wrist radial deviation 0 6 15  Wrist pronation 45 WFL 90  Wrist supination 90  WFL 90  (Blank rows = not tested)  Active ROM Left eval Left 08/31/22 Left 09/11/22  Thumb MCP (0-60) 72 65   Thumb IP (0-80) 21 50   Thumb Opposition to Small Finger  0 Able   Index MCP (0-90)  72 65   Index PIP (0-100)  62 95   Index DIP (0-70)  30 60   Long MCP (0-90)  68 75   Long PIP (0-100)  62 85   Long DIP (0-70)  55 55   Ring MCP (0-90)  58 85   Ring PIP (0-100)  82 85   Ring DIP (0-70)  40 55   Little MCP (0-90)  74 85   Little PIP (0-100)  58 80   Little DIP (0-70)  45 55   (Blank rows = not tested)    -09/11/22: Pt is able to make a full fist now.   UPPER EXTREMITY MMT:     MMT Left eval Left 08/31/22 Left 09/11/22  Elbow flexion 5/5 5/5   Elbow extension 5/5 5/5    Wrist flexion 3-/5 4+/5 4+/5  Wrist extension 1/5 3/5 4-/5  Wrist ulnar deviation 0/5 4-/5 4-/5  Wrist radial deviation 0/5 3+/5 4-/5  Wrist pronation 2/5 3+/5 5/5  Wrist supination 3-/5 4/5 5/5  (Blank rows = not tested)  HAND FUNCTION: Grip strength: Right: 68 lbs; Left: 10 lbs, Lateral pinch: Right: 13 lbs, Left: 6 lbs, and 3 point pinch: Right: 14 lbs, Left: 1 lbs Grip strength: (08/31/22)Left: 20 lbs, Lateral pinch: Left: 8 lbs, and 3 point pinch: Left: 9 lbs Grip strength: (09/11/22)Left: 24 lbs, Lateral pinch: Left: 9 lbs, and 3 point pinch: Left: 9 lbs  COORDINATION: Unable to complete due to deficits 08/31/22: 9 Hole Peg Test: Right: 23.15 sec; Left: 41.42 sec 09/11/22: 9 Hole Peg Test: Left: 29.04" sec  SENSATION: Light touch: WFL  EDEMA: None noted               08/31/22: mild swelling along the dorsal aspect of the hand  COGNITION: Overall cognitive status: Difficulty to assess due to: lack of sleep, emotional, tangential   TODAY'S TREATMENT:                                                                                                                              DATE:  09/25/22 -A/ROM: wrist flexion/extension, ulnar/radial Deviation, supination/pronation, x15 -Hand gripper: large beads gripper vertical at 25#, medium beads gripper vertical at 25#, small beads gripper horizontal at 25# -Coin manipulation: pt holding coins in palm and working on palm to fingertip translation to bring to fingertips and place in slotted container, while holding remaining coins in palm without dropping. Good form, pt did not drop any coins from palm during placement.  -Flicking sponges: pt flicking 5 sponges with each digit, working on digit extension and combining speed, more difficulty with digits 4-5  09/22/22 -A/ROM: wrist flexion/extension, ulnar/radial Deviation, supination/pronation, x10 -  Digit ROM: abduction/adduction, composite flexion, digit flicking, finger tapping with support at  the forearm, x10 each -Wrist Strengthening: on foam roll, 2lb dumbbell, flexion/extension, ulnar/radial deviation, supination/pronation, x10  -Theraputty: red, roll into a ball, flatten into pancake, pull apart into a log, tip pinch x8, full squeeze x10 -Coins, Picking up 10 coins and placing in a piggy bank  09/13/22 -Wrist self-stretch: extension, 3x20" holds, elbow tucked by side -A/ROM: wrist flexion/extension, ulnar/radial Deviation, supination/pronation, x10 -Gripper: 20lb stacking 6 cubes, 22lbs stacking 6 cubes (max difficulty), 25lbs full squeezes x10 -Digit ROM: abduction/adduction, composite flexion, digit flicking, finger tapping with support at the forearm, x10 each -Wrist Strengthening: on foam roll, 2lb dumbbell, flexion/extension, ulnar/radial deviation, supination/pronation, x10    PATIENT EDUCATION: Education details: Contractor balls Person educated: Patient Education method: Consulting civil engineer, Demonstration, and Handouts Education comprehension: verbalized understanding and returned demonstration  HOME EXERCISE PROGRAM: Eval: weightbearing 1/5: Radial Nerve Tendon Glide 1/19: Digit ROM 1/30: weighted wrist stretches 2/1: Theraputty Exercise 2/8: Wrist A/ROM [id number]: Flicking cotton balls 3/8: Coin Exercises [id number]: flicking cotton balls    GOALS: Goals reviewed with patient? Yes  SHORT TERM GOALS: Target date: 08/08/22  Pt will be provided with and educated on HEP to improve mobility in LUE required for use during ADL completion.   Goal status: MET  2.  Pt will be provided with and educated on splint wear and care for radial nerve palsy, demonstrating independence in donning and doffing.   Goal status: MET  3.  Pt will increase left wrist strength to 3+/5 to improve ability to reach for items using his left hand and maintain a light grasp.   Goal status: MET  4.  Pt will increase left grip strength by 5# and pinch strength by 2# to improve  ability to grasp and maintain hold on tools for ADL completion such as a cup, toothbrush, clothing, etc.   Goal status: MET    LONG TERM GOALS: Target date: 09/09/22  Pt will decrease pain in LUE to 3/10 or less to improve ability to sleep for 2+ consecutive hours without waking due to pain.   Goal status: IN PROGRESS  2.  Pt will decrease LUE fascial restrictions to min amounts or less to improve mobility required for functional reaching tasks.   Goal status: IN PROGRESS  3.  Pt will increase LUE A/ROM by at least 25 degrees to improve ability to use LUE when reaching overhead or behind back during dressing and bathing tasks.   Goal status: IN PROGRESS  4.  Pt will increase LUE strength to 4/5 or greater to improve ability to use LUE when lifting or carrying items during meal preparation/housework/yardwork tasks.   Goal status: IN PROGRESS  5.  Pt will return to highest level of function using LUE as dominant during functional task completion.   Goal status: IN PROGRESS      ASSESSMENT:  CLINICAL IMPRESSION: Pt reports he did not try his updated HEP exercises with the coins, he forgot. He did do putty exercises last night. Continued with A/ROM exercises today, pt with good wrist extension demonstrated, increased reps to 15. Pt completing hand gripper task, OT noting improved ability to maintain neutral or minimally flexed wrist position today compared to significant flexion in previous sessions. Continued with coin manipulation tasks today, pt did great maintaining hold on coins in palm during placement. Added sponge flicking today, great form and success, more difficulty with motor planning for digits 4-5. Verbal cuing for form  and technique.    PERFORMANCE DEFICITS: in functional skills including ADLs, IADLs, coordination, dexterity, proprioception, sensation, edema, tone, ROM, strength, pain, fascial restrictions, Fine motor control, endurance, and UE functional use,  cognitive skills including attention, emotional, and thought, and psychosocial skills including coping strategies.    PLAN:  OT FREQUENCY: 2x/week  OT DURATION: 4 weeks  PLANNED INTERVENTIONS: self care/ADL training, therapeutic exercise, therapeutic activity, neuromuscular re-education, manual therapy, passive range of motion, splinting, electrical stimulation, paraffin, patient/family education, and DME and/or AE instructions  RECOMMENDED OTHER SERVICES: follow up with MD regarding sleep  CONSULTED AND AGREED WITH PLAN OF CARE: Patient  PLAN FOR NEXT SESSION: Follow up on HEP,  wrist strengthening using low weight, grip and pinch strengthening, coordination tasks   Guadelupe Sabin, OTR/L  [phone] 09/25/2022, 11:14 AM

## 2022-09-29 ENCOUNTER — Encounter (HOSPITAL_COMMUNITY): Payer: Self-pay | Admitting: Occupational Therapy

## 2022-09-29 ENCOUNTER — Ambulatory Visit (HOSPITAL_COMMUNITY): Payer: BLUE CROSS/BLUE SHIELD | Admitting: Occupational Therapy

## 2022-09-29 DIAGNOSIS — M25632 Stiffness of left wrist, not elsewhere classified: Secondary | ICD-10-CM

## 2022-09-29 DIAGNOSIS — M25532 Pain in left wrist: Secondary | ICD-10-CM

## 2022-09-29 DIAGNOSIS — R29818 Other symptoms and signs involving the nervous system: Secondary | ICD-10-CM

## 2022-09-29 NOTE — Therapy (Unsigned)
OUTPATIENT OCCUPATIONAL THERAPY ORTHO TREATMENT NOTE  Patient Name: Dalton Guerrero MRN: [id number] DOB:1961/03/07, 62 y.o., male Today's Date: 09/29/2022  PCP: Dr. Modena Morrow REFERRING PROVIDER: [name], PA-C  END OF SESSION:  OT End of Session - 09/29/22 0738     Visit Number 16    Number of Visits 20    Date for OT Re-Evaluation 10/15/22    Authorization Type [employer]    Progress Note Due on Visit 20    OT Start Time [phone]    OT Stop Time 0815    OT Time Calculation (min) 38 min    Activity Tolerance Patient tolerated treatment well    Behavior During Therapy WFL for tasks assessed/performed             Past Medical History:  Diagnosis Date   Hypertension    Past Surgical History:  Procedure Laterality Date   KIDNEY SURGERY Bilateral    Pt. 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   ONSET DATE: 02/28/22  REFERRING DIAG: right RC tear, left radial nerve palsy, left wrist contracture  THERAPY DIAG:  Pain in left wrist  Stiffness of left wrist, not elsewhere classified  Other symptoms and signs involving the nervous system  Rationale for Evaluation and Treatment: Rehabilitation  SUBJECTIVE:   SUBJECTIVE STATEMENT: S: "I think I am getting a little better."   PERTINENT HISTORY: Pt is a 63 y/o male presenting with right RC tear, left radial nerve palsy, and left wrist contracture. Pt presents in a standard prefabricated wrist splint today, minimal benefit to wrist and left hand. Pt fell several years ago landing on his right shoulder, then had surgery. In March, pt fell at work when tripping over a mat, then later tried to lift a box overhead and sustained a RC tear. Pt was scheduled for a reverse TSA, but has postponed due to the severity of deficits with his left hand and wrist. Pt has had a nerve conduction study completed, pt reports he was told that it would be 6 months before he saw improvement. Currently,  the pt's primary concern is his left hand and wrist, this is where he would like therapy to focus.   PRECAUTIONS: None  WEIGHT BEARING RESTRICTIONS: No  PAIN:  Are you having pain? Yes: NPRS scale: 7/10 Pain location: right shoulder and L wrist Pain description: aching, sore Aggravating factors: movement, use, lifting Relieving factors: nothing  FALLS: Has patient fallen in last 6 months? Yes. Number of falls 2  PATIENT GOALS: To get his left hand working again.   NEXT MD VISIT: unsure  OBJECTIVE:   HAND DOMINANCE: Left  ADLs: Overall ADLs: Pt reports he can wash the left side of his body and he can lift a lightweight milk jug with his left hand, otherwise he is unable to use his left hand functionally. He has been soaking his hand in epsom salts and hot water to stretch it.   FUNCTIONAL OUTCOME MEASURES: Quick Dash: 81.82 Quick Dash: (09/04/22): 59.09  UPPER EXTREMITY ROM:     Active ROM Left eval Left 08/31/22 Left 09/11/22  Elbow flexion 130 140   Elbow extension 0 0   Wrist flexion 50 51 50  Wrist extension 0 16 17  Wrist ulnar deviation 0 24 38  Wrist radial deviation 0 6 15  Wrist pronation 45 WFL 90  Wrist supination 90 WFL 90  (Blank rows = not tested)  Active ROM  Left eval Left 08/31/22 Left 09/11/22  Thumb MCP (0-60) 72 65   Thumb IP (0-80) 21 50   Thumb Opposition to Small Finger  0 Able   Index MCP (0-90)  72 65   Index PIP (0-100)  62 95   Index DIP (0-70)  30 60   Long MCP (0-90)  68 75   Long PIP (0-100)  62 85   Long DIP (0-70)  55 55   Ring MCP (0-90)  58 85   Ring PIP (0-100)  82 85   Ring DIP (0-70)  40 55   Little MCP (0-90)  74 85   Little PIP (0-100)  58 80   Little DIP (0-70)  45 55   (Blank rows = not tested)    -09/11/22: Pt is able to make a full fist now.   UPPER EXTREMITY MMT:     MMT Left eval Left 08/31/22 Left 09/11/22  Elbow flexion 5/5 5/5   Elbow extension 5/5 5/5   Wrist flexion 3-/5 4+/5 4+/5  Wrist extension  1/5 3/5 4-/5  Wrist ulnar deviation 0/5 4-/5 4-/5  Wrist radial deviation 0/5 3+/5 4-/5  Wrist pronation 2/5 3+/5 5/5  Wrist supination 3-/5 4/5 5/5  (Blank rows = not tested)  HAND FUNCTION: Grip strength: Right: 68 lbs; Left: 10 lbs, Lateral pinch: Right: 13 lbs, Left: 6 lbs, and 3 point pinch: Right: 14 lbs, Left: 1 lbs Grip strength: (08/31/22)Left: 20 lbs, Lateral pinch: Left: 8 lbs, and 3 point pinch: Left: 9 lbs Grip strength: (09/11/22)Left: 24 lbs, Lateral pinch: Left: 9 lbs, and 3 point pinch: Left: 9 lbs  COORDINATION: Unable to complete due to deficits 08/31/22: 9 Hole Peg Test: Right: 23.15 sec; Left: 41.42 sec 09/11/22: 9 Hole Peg Test: Left: 29.04" sec  SENSATION: Light touch: WFL  EDEMA: None noted               08/31/22: mild swelling along the dorsal aspect of the hand  COGNITION: Overall cognitive status: Difficulty to assess due to: lack of sleep, emotional, tangential   TODAY'S TREATMENT:                                                                                                                              DATE:  09/29/22 -A/ROM: wrist flexion/extension, ulnar/radial Deviation, supination/pronation, x15 -Wrist Strengthening: on foam roll, 2lb dumbbell, flexion/extension, ulnar/radial deviation, supination/pronation, x10  -Screws: holding all screws and balancing them on their heads, x5 large screws, x6 medium screws, x8 small screws, x10 large screws -Pinch strengthening: red resistance clips to stack 6 blocks, green resistance clips to stack 6 blocks -Sponges: pinch and hold as many sponges as possible, 20 sponges in total  09/25/22 -A/ROM: wrist flexion/extension, ulnar/radial Deviation, supination/pronation, x15 -Hand gripper: large beads gripper vertical at 25#, medium beads gripper vertical at 25#, small beads gripper horizontal at 25# -Coin manipulation: pt holding coins in palm and working on palm to  fingertip translation to bring to fingertips and  place in slotted container, while holding remaining coins in palm without dropping. Good form, pt did not drop any coins from palm during placement.  -Flicking sponges: pt flicking 5 sponges with each digit, working on digit extension and combining speed, more difficulty with digits 4-5  09/22/22 -A/ROM: wrist flexion/extension, ulnar/radial Deviation, supination/pronation, x10 -Digit ROM: abduction/adduction, composite flexion, digit flicking, finger tapping with support at the forearm, x10 each -Wrist Strengthening: on foam roll, 2lb dumbbell, flexion/extension, ulnar/radial deviation, supination/pronation, x10  -Theraputty: red, roll into a ball, flatten into pancake, pull apart into a log, tip pinch x8, full squeeze x10 -Coins, Picking up 10 coins and placing in a piggy bank    PATIENT EDUCATION: Education details: reviewed wrist strengthening Person educated: Patient Education method: Explanation, Demonstration, and Handouts Education comprehension: verbalized understanding and returned demonstration  HOME EXERCISE PROGRAM: Eval: weightbearing 1/5: Radial Nerve Tendon Glide 1/19: Digit ROM 1/30: weighted wrist stretches 2/1: Theraputty Exercise 2/8: Wrist A/ROM [id number]: Flicking cotton balls 3/8: Coin Exercises [id number]: flicking cotton balls    GOALS: Goals reviewed with patient? Yes  SHORT TERM GOALS: Target date: 08/08/22  Pt will be provided with and educated on HEP to improve mobility in LUE required for use during ADL completion.   Goal status: MET  2.  Pt will be provided with and educated on splint wear and care for radial nerve palsy, demonstrating independence in donning and doffing.   Goal status: MET  3.  Pt will increase left wrist strength to 3+/5 to improve ability to reach for items using his left hand and maintain a light grasp.   Goal status: MET  4.  Pt will increase left grip strength by 5# and pinch strength by 2# to improve ability to grasp and  maintain hold on tools for ADL completion such as a cup, toothbrush, clothing, etc.   Goal status: MET    LONG TERM GOALS: Target date: 09/09/22  Pt will decrease pain in LUE to 3/10 or less to improve ability to sleep for 2+ consecutive hours without waking due to pain.   Goal status: IN PROGRESS  2.  Pt will decrease LUE fascial restrictions to min amounts or less to improve mobility required for functional reaching tasks.   Goal status: IN PROGRESS  3.  Pt will increase LUE A/ROM by at least 25 degrees to improve ability to use LUE when reaching overhead or behind back during dressing and bathing tasks.   Goal status: IN PROGRESS  4.  Pt will increase LUE strength to 4/5 or greater to improve ability to use LUE when lifting or carrying items during meal preparation/housework/yardwork tasks.   Goal status: IN PROGRESS  5.  Pt will return to highest level of function using LUE as dominant during functional task completion.   Goal status: IN PROGRESS      ASSESSMENT:  CLINICAL IMPRESSION: This session pt continuing to demonstrate good improvement with ROM and strength. He is demonstrating increased ability to extend his wrist while completing functional tasks. Additionally, this session he was completing in hand translation with up to 10 tiny screws. He reports minimal pain at this time, only feeling like he still has minimal control and strength of his hand to complete functional tasks. OT providing maximal encouragement and verbal/tactile cuing throughout session for positioning, technique, and attention.    PERFORMANCE DEFICITS: in functional skills including ADLs, IADLs, coordination, dexterity, proprioception, sensation, edema, tone, ROM, strength, pain, fascial  restrictions, Fine motor control, endurance, and UE functional use, cognitive skills including attention, emotional, and thought, and psychosocial skills including coping strategies.    PLAN:  OT FREQUENCY:  2x/week  OT DURATION: 4 weeks  PLANNED INTERVENTIONS: self care/ADL training, therapeutic exercise, therapeutic activity, neuromuscular re-education, manual therapy, passive range of motion, splinting, electrical stimulation, paraffin, patient/family education, and DME and/or AE instructions  RECOMMENDED OTHER SERVICES: follow up with MD regarding sleep  CONSULTED AND AGREED WITH PLAN OF CARE: Patient  PLAN FOR NEXT SESSION: Follow up on HEP,  wrist strengthening using low weight, grip and pinch strengthening, coordination tasks   Paulita Fujita, OTR/L [phone] 09/29/2022, 7:40 AM

## 2022-10-03 ENCOUNTER — Ambulatory Visit (HOSPITAL_COMMUNITY): Payer: BLUE CROSS/BLUE SHIELD | Admitting: Occupational Therapy

## 2022-10-03 ENCOUNTER — Encounter (HOSPITAL_COMMUNITY): Payer: Self-pay | Admitting: Occupational Therapy

## 2022-10-03 DIAGNOSIS — R29818 Other symptoms and signs involving the nervous system: Secondary | ICD-10-CM

## 2022-10-03 DIAGNOSIS — M25532 Pain in left wrist: Secondary | ICD-10-CM | POA: Diagnosis not present

## 2022-10-03 DIAGNOSIS — M25632 Stiffness of left wrist, not elsewhere classified: Secondary | ICD-10-CM

## 2022-10-03 NOTE — Therapy (Unsigned)
OUTPATIENT OCCUPATIONAL THERAPY ORTHO TREATMENT NOTE  Patient Name: Dalton Guerrero MRN: [id number] DOB:1961-06-29, 62 y.o., male Today's Date: 10/03/2022  PCP: Dr. Modena Morrow REFERRING PROVIDER: [name], PA-C  END OF SESSION:  OT End of Session - 10/03/22 0953     Visit Number 17    Number of Visits 20    Date for OT Re-Evaluation 10/15/22    Authorization Type [employer]    Progress Note Due on Visit 20    OT Start Time 0950    OT Stop Time 1030    OT Time Calculation (min) 40 min    Activity Tolerance Patient tolerated treatment well    Behavior During Therapy WFL for tasks assessed/performed             Past Medical History:  Diagnosis Date   Hypertension    Past Surgical History:  Procedure Laterality Date   KIDNEY SURGERY Bilateral    Pt. 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   ONSET DATE: 02/28/22  REFERRING DIAG: right RC tear, left radial nerve palsy, left wrist contracture  THERAPY DIAG:  Pain in left wrist  Stiffness of left wrist, not elsewhere classified  Other symptoms and signs involving the nervous system  Rationale for Evaluation and Treatment: Rehabilitation  SUBJECTIVE:   SUBJECTIVE STATEMENT: S: "I think I am getting a little better."   PERTINENT HISTORY: Pt is a 62 y/o male presenting with right RC tear, left radial nerve palsy, and left wrist contracture. Pt presents in a standard prefabricated wrist splint today, minimal benefit to wrist and left hand. Pt fell several years ago landing on his right shoulder, then had surgery. In March, pt fell at work when tripping over a mat, then later tried to lift a box overhead and sustained a RC tear. Pt was scheduled for a reverse TSA, but has postponed due to the severity of deficits with his left hand and wrist. Pt has had a nerve conduction study completed, pt reports he was told that it would be 6 months before he saw improvement. Currently,  the pt's primary concern is his left hand and wrist, this is where he would like therapy to focus.   PRECAUTIONS: None  WEIGHT BEARING RESTRICTIONS: No  PAIN:  Are you having pain? Yes: NPRS scale: 7/10 Pain location: right shoulder and L wrist Pain description: aching, sore Aggravating factors: movement, use, lifting Relieving factors: nothing  FALLS: Has patient fallen in last 6 months? Yes. Number of falls 2  PATIENT GOALS: To get his left hand working again.   NEXT MD VISIT: unsure  OBJECTIVE:   HAND DOMINANCE: Left  ADLs: Overall ADLs: Pt reports he can wash the left side of his body and he can lift a lightweight milk jug with his left hand, otherwise he is unable to use his left hand functionally. He has been soaking his hand in epsom salts and hot water to stretch it.   FUNCTIONAL OUTCOME MEASURES: Quick Dash: 81.82 Quick Dash: (09/04/22): 59.09  UPPER EXTREMITY ROM:     Active ROM Left eval Left 08/31/22 Left 09/11/22  Elbow flexion 130 140   Elbow extension 0 0   Wrist flexion 50 51 50  Wrist extension 0 16 17  Wrist ulnar deviation 0 24 38  Wrist radial deviation 0 6 15  Wrist pronation 45 WFL 90  Wrist supination 90 WFL 90  (Blank rows = not tested)  Active ROM  Left eval Left 08/31/22 Left 09/11/22  Thumb MCP (0-60) 72 65   Thumb IP (0-80) 21 50   Thumb Opposition to Small Finger  0 Able   Index MCP (0-90)  72 65   Index PIP (0-100)  62 95   Index DIP (0-70)  30 60   Long MCP (0-90)  68 75   Long PIP (0-100)  62 85   Long DIP (0-70)  55 55   Ring MCP (0-90)  58 85   Ring PIP (0-100)  82 85   Ring DIP (0-70)  40 55   Little MCP (0-90)  74 85   Little PIP (0-100)  58 80   Little DIP (0-70)  45 55   (Blank rows = not tested)    -09/11/22: Pt is able to make a full fist now.   UPPER EXTREMITY MMT:     MMT Left eval Left 08/31/22 Left 09/11/22  Elbow flexion 5/5 5/5   Elbow extension 5/5 5/5   Wrist flexion 3-/5 4+/5 4+/5  Wrist extension  1/5 3/5 4-/5  Wrist ulnar deviation 0/5 4-/5 4-/5  Wrist radial deviation 0/5 3+/5 4-/5  Wrist pronation 2/5 3+/5 5/5  Wrist supination 3-/5 4/5 5/5  (Blank rows = not tested)  HAND FUNCTION: Grip strength: Right: 68 lbs; Left: 10 lbs, Lateral pinch: Right: 13 lbs, Left: 6 lbs, and 3 point pinch: Right: 14 lbs, Left: 1 lbs Grip strength: (08/31/22)Left: 20 lbs, Lateral pinch: Left: 8 lbs, and 3 point pinch: Left: 9 lbs Grip strength: (09/11/22)Left: 24 lbs, Lateral pinch: Left: 9 lbs, and 3 point pinch: Left: 9 lbs  COORDINATION: Unable to complete due to deficits 08/31/22: 9 Hole Peg Test: Right: 23.15 sec; Left: 41.42 sec 09/11/22: 9 Hole Peg Test: Left: 29.04" sec  SENSATION: Light touch: WFL  EDEMA: None noted               08/31/22: mild swelling along the dorsal aspect of the hand  COGNITION: Overall cognitive status: Difficulty to assess due to: lack of sleep, emotional, tangential   TODAY'S TREATMENT:                                                                                                                              DATE:  10/03/22 -Digit ROM: digit composite flexion, abduction/adduction, opposition, finger taps, x15 -Towel scrunches Q000111Q -Flicking: x6 foam blocks each digit -Digiflex: 3lbs, x10 full squeeze, x12 each digit flex -Gripper: 25lbs, x10 medium beads in vertical -Pinch Strengthening: Green resistance clips, picking up large pegs and completing peg board pattern.  09/29/22 -A/ROM: wrist flexion/extension, ulnar/radial Deviation, supination/pronation, x15 -Wrist Strengthening: on foam roll, 2lb dumbbell, flexion/extension, ulnar/radial deviation, supination/pronation, x10  -Screws: holding all screws and balancing them on their heads, x5 large screws, x6 medium screws, x8 small screws, x10 large screws -Pinch strengthening: red resistance clips to stack 6 blocks, green resistance clips to stack 6 blocks -Sponges: pinch and  hold as many sponges as possible,  20 sponges in total  09/25/22 -A/ROM: wrist flexion/extension, ulnar/radial Deviation, supination/pronation, x15 -Hand gripper: large beads gripper vertical at 25#, medium beads gripper vertical at 25#, small beads gripper horizontal at 25# -Coin manipulation: pt holding coins in palm and working on palm to fingertip translation to bring to fingertips and place in slotted container, while holding remaining coins in palm without dropping. Good form, pt did not drop any coins from palm during placement.  -Flicking sponges: pt flicking 5 sponges with each digit, working on digit extension and combining speed, more difficulty with digits 4-5  09/22/22 -A/ROM: wrist flexion/extension, ulnar/radial Deviation, supination/pronation, x10 -Digit ROM: abduction/adduction, composite flexion, digit flicking, finger tapping with support at the forearm, x10 each -Wrist Strengthening: on foam roll, 2lb dumbbell, flexion/extension, ulnar/radial deviation, supination/pronation, x10  -Theraputty: red, roll into a ball, flatten into pancake, pull apart into a log, tip pinch x8, full squeeze x10 -Coins, Picking up 10 coins and placing in a piggy bank    PATIENT EDUCATION: Education details: reviewed wrist strengthening Person educated: Patient Education method: Explanation, Demonstration, and Handouts Education comprehension: verbalized understanding and returned demonstration  HOME EXERCISE PROGRAM: Eval: weightbearing 1/5: Radial Nerve Tendon Glide 1/19: Digit ROM 1/30: weighted wrist stretches 2/1: Theraputty Exercise 2/8: Wrist A/ROM [id number]: Flicking cotton balls 3/8: Coin Exercises [id number]: flicking cotton balls    GOALS: Goals reviewed with patient? Yes  SHORT TERM GOALS: Target date: 08/08/22  Pt will be provided with and educated on HEP to improve mobility in LUE required for use during ADL completion.   Goal status: MET  2.  Pt will be provided with and educated on splint wear and care for  radial nerve palsy, demonstrating independence in donning and doffing.   Goal status: MET  3.  Pt will increase left wrist strength to 3+/5 to improve ability to reach for items using his left hand and maintain a light grasp.   Goal status: MET  4.  Pt will increase left grip strength by 5# and pinch strength by 2# to improve ability to grasp and maintain hold on tools for ADL completion such as a cup, toothbrush, clothing, etc.   Goal status: MET    LONG TERM GOALS: Target date: 09/09/22  Pt will decrease pain in LUE to 3/10 or less to improve ability to sleep for 2+ consecutive hours without waking due to pain.   Goal status: IN PROGRESS  2.  Pt will decrease LUE fascial restrictions to min amounts or less to improve mobility required for functional reaching tasks.   Goal status: IN PROGRESS  3.  Pt will increase LUE A/ROM by at least 25 degrees to improve ability to use LUE when reaching overhead or behind back during dressing and bathing tasks.   Goal status: IN PROGRESS  4.  Pt will increase LUE strength to 4/5 or greater to improve ability to use LUE when lifting or carrying items during meal preparation/housework/yardwork tasks.   Goal status: IN PROGRESS  5.  Pt will return to highest level of function using LUE as dominant during functional task completion.   Goal status: IN PROGRESS      ASSESSMENT:  CLINICAL IMPRESSION: This session pt continuing to demonstrate good improvement with ROM and strength. He is demonstrating increased ability to extend his wrist while completing functional tasks. Additionally, this session he was completing in hand translation with up to 10 tiny screws. He reports minimal pain at this time, only  feeling like he still has minimal control and strength of his hand to complete functional tasks. OT providing maximal encouragement and verbal/tactile cuing throughout session for positioning, technique, and attention.    PERFORMANCE  DEFICITS: in functional skills including ADLs, IADLs, coordination, dexterity, proprioception, sensation, edema, tone, ROM, strength, pain, fascial restrictions, Fine motor control, endurance, and UE functional use, cognitive skills including attention, emotional, and thought, and psychosocial skills including coping strategies.    PLAN:  OT FREQUENCY: 2x/week  OT DURATION: 4 weeks  PLANNED INTERVENTIONS: self care/ADL training, therapeutic exercise, therapeutic activity, neuromuscular re-education, manual therapy, passive range of motion, splinting, electrical stimulation, paraffin, patient/family education, and DME and/or AE instructions  RECOMMENDED OTHER SERVICES: follow up with MD regarding sleep  CONSULTED AND AGREED WITH PLAN OF CARE: Patient  PLAN FOR NEXT SESSION: Follow up on HEP,  wrist strengthening using low weight, grip and pinch strengthening, coordination tasks   Paulita Fujita, OTR/L [phone] 10/03/2022, 9:54 AM

## 2022-10-06 ENCOUNTER — Encounter (HOSPITAL_COMMUNITY): Payer: Medicaid Other | Admitting: Occupational Therapy

## 2022-10-11 ENCOUNTER — Ambulatory Visit (HOSPITAL_COMMUNITY): Payer: BLUE CROSS/BLUE SHIELD | Admitting: Occupational Therapy

## 2022-10-11 ENCOUNTER — Encounter (HOSPITAL_COMMUNITY): Payer: Self-pay | Admitting: Occupational Therapy

## 2022-10-11 DIAGNOSIS — M25532 Pain in left wrist: Secondary | ICD-10-CM

## 2022-10-11 DIAGNOSIS — R29818 Other symptoms and signs involving the nervous system: Secondary | ICD-10-CM

## 2022-10-11 DIAGNOSIS — M25632 Stiffness of left wrist, not elsewhere classified: Secondary | ICD-10-CM

## 2022-10-11 NOTE — Therapy (Signed)
OUTPATIENT OCCUPATIONAL THERAPY ORTHO TREATMENT NOTE  Patient Name: Dalton Guerrero MRN: [id number] DOB:07-03-1961, 62 y.o., male Today's Date: 10/11/2022  PCP: Dr. Modena Morrow REFERRING PROVIDER: [name], PA-C  END OF SESSION:  OT End of Session - 10/11/22 0909     Visit Number 18    Number of Visits 20    Date for OT Re-Evaluation 10/15/22    Authorization Type [employer]    Progress Note Due on Visit 20    OT Start Time 0906    OT Stop Time 0945    OT Time Calculation (min) 39 min    Activity Tolerance Patient tolerated treatment well    Behavior During Therapy WFL for tasks assessed/performed             Past Medical History:  Diagnosis Date   Hypertension    Past Surgical History:  Procedure Laterality Date   KIDNEY SURGERY Bilateral    Pt. 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   ONSET DATE: 02/28/22  REFERRING DIAG: right RC tear, left radial nerve palsy, left wrist contracture  THERAPY DIAG:  Pain in left wrist  Stiffness of left wrist, not elsewhere classified  Other symptoms and signs involving the nervous system  Rationale for Evaluation and Treatment: Rehabilitation  SUBJECTIVE:   SUBJECTIVE STATEMENT: S: "I just don't think my hand is getting any better."   PERTINENT HISTORY: Pt is a 62 y/o male presenting with right RC tear, left radial nerve palsy, and left wrist contracture. Pt presents in a standard prefabricated wrist splint today, minimal benefit to wrist and left hand. Pt fell several years ago landing on his right shoulder, then had surgery. In March, pt fell at work when tripping over a mat, then later tried to lift a box overhead and sustained a RC tear. Pt was scheduled for a reverse TSA, but has postponed due to the severity of deficits with his left hand and wrist. Pt has had a nerve conduction study completed, pt reports he was told that it would be 6 months before he saw improvement.  Currently, the pt's primary concern is his left hand and wrist, this is where he would like therapy to focus.   PRECAUTIONS: None  WEIGHT BEARING RESTRICTIONS: No  PAIN:  Are you having pain? Yes: NPRS scale: 5/10 Pain location: right shoulder and L wrist Pain description: aching, sore Aggravating factors: movement, use, lifting Relieving factors: nothing  FALLS: Has patient fallen in last 6 months? Yes. Number of falls 2  PATIENT GOALS: To get his left hand working again.   NEXT MD VISIT: unsure  OBJECTIVE:   HAND DOMINANCE: Left  ADLs: Overall ADLs: Pt reports he can wash the left side of his body and he can lift a lightweight milk jug with his left hand, otherwise he is unable to use his left hand functionally. He has been soaking his hand in epsom salts and hot water to stretch it.   FUNCTIONAL OUTCOME MEASURES: Quick Dash: 81.82 Quick Dash: (09/04/22): 59.09  UPPER EXTREMITY ROM:     Active ROM Left eval Left 08/31/22 Left 09/11/22  Elbow flexion 130 140   Elbow extension 0 0   Wrist flexion 50 51 50  Wrist extension 0 16 17  Wrist ulnar deviation 0 24 38  Wrist radial deviation 0 6 15  Wrist pronation 45 WFL 90  Wrist supination 90 WFL 90  (Blank rows = not tested)  Active ROM Left eval Left 08/31/22 Left 09/11/22  Thumb MCP (0-60) 72 65   Thumb IP (0-80) 21 50   Thumb Opposition to Small Finger  0 Able   Index MCP (0-90)  72 65   Index PIP (0-100)  62 95   Index DIP (0-70)  30 60   Long MCP (0-90)  68 75   Long PIP (0-100)  62 85   Long DIP (0-70)  55 55   Ring MCP (0-90)  58 85   Ring PIP (0-100)  82 85   Ring DIP (0-70)  40 55   Little MCP (0-90)  74 85   Little PIP (0-100)  58 80   Little DIP (0-70)  45 55   (Blank rows = not tested)    -09/11/22: Pt is able to make a full fist now.   UPPER EXTREMITY MMT:     MMT Left eval Left 08/31/22 Left 09/11/22  Elbow flexion 5/5 5/5   Elbow extension 5/5 5/5   Wrist flexion 3-/5 4+/5 4+/5  Wrist  extension 1/5 3/5 4-/5  Wrist ulnar deviation 0/5 4-/5 4-/5  Wrist radial deviation 0/5 3+/5 4-/5  Wrist pronation 2/5 3+/5 5/5  Wrist supination 3-/5 4/5 5/5  (Blank rows = not tested)  HAND FUNCTION: Grip strength: Right: 68 lbs; Left: 10 lbs, Lateral pinch: Right: 13 lbs, Left: 6 lbs, and 3 point pinch: Right: 14 lbs, Left: 1 lbs Grip strength: (08/31/22)Left: 20 lbs, Lateral pinch: Left: 8 lbs, and 3 point pinch: Left: 9 lbs Grip strength: (09/11/22)Left: 24 lbs, Lateral pinch: Left: 9 lbs, and 3 point pinch: Left: 9 lbs  COORDINATION: Unable to complete due to deficits 08/31/22: 9 Hole Peg Test: Right: 23.15 sec; Left: 41.42 sec 09/11/22: 9 Hole Peg Test: Left: 29.04" sec  SENSATION: Light touch: WFL  EDEMA: None noted               08/31/22: mild swelling along the dorsal aspect of the hand  COGNITION: Overall cognitive status: Difficulty to assess due to: lack of sleep, emotional, tangential   TODAY'S TREATMENT:                                                                                                                              DATE:  10/11/22 -Wrist ROM: flexion/extension, ulnar/radial deviation, supination/pronation, x15 -Wrist Strengthening: flexion (3lbs), extension (2lb), ulnar/radial deviation (3lb), supination/pronation (3lb), x15 -Wrist extension stretch on foam roll with 4lb dumbbell, 2x30" -Pinch strengthening: D4 and D5 pinch,   10/03/22 -Digit ROM: digit composite flexion, abduction/adduction, opposition, finger taps, x15 -Towel scrunches Q000111Q -Flicking: x6 foam blocks each digit -Digiflex: 3lbs, x10 full squeeze, x12 each digit flex -Gripper: 25lbs, x10 medium beads in vertical -Pinch Strengthening: Green resistance clips, picking up large pegs and completing peg board pattern.  09/29/22 -A/ROM: wrist flexion/extension, ulnar/radial Deviation, supination/pronation, x15 -Wrist Strengthening: on foam roll, 2lb dumbbell, flexion/extension, ulnar/radial  deviation, supination/pronation, x10  -  Screws: holding all screws and balancing them on their heads, x5 large screws, x6 medium screws, x8 small screws, x10 large screws -Pinch strengthening: red resistance clips to stack 6 blocks, green resistance clips to stack 6 blocks -Sponges: pinch and hold as many sponges as possible, 20 sponges in total    PATIENT EDUCATION: Education details: Weighted wrist extension stretch Person educated: Patient Education method: Explanation, Demonstration, and Handouts Education comprehension: verbalized understanding and returned demonstration  HOME EXERCISE PROGRAM: Eval: weightbearing 1/5: Radial Nerve Tendon Glide 1/19: Digit ROM 1/30: weighted wrist stretches 2/1: Theraputty Exercise 2/8: Wrist A/ROM 3/8: Coin Exercises Q000111Q: flicking cotton balls 3/27: Weight wrist extension stretch    GOALS: Goals reviewed with patient? Yes  SHORT TERM GOALS: Target date: 08/08/22  Pt will be provided with and educated on HEP to improve mobility in LUE required for use during ADL completion.   Goal status: MET  2.  Pt will be provided with and educated on splint wear and care for radial nerve palsy, demonstrating independence in donning and doffing.   Goal status: MET  3.  Pt will increase left wrist strength to 3+/5 to improve ability to reach for items using his left hand and maintain a light grasp.   Goal status: MET  4.  Pt will increase left grip strength by 5# and pinch strength by 2# to improve ability to grasp and maintain hold on tools for ADL completion such as a cup, toothbrush, clothing, etc.   Goal status: MET    LONG TERM GOALS: Target date: 09/09/22  Pt will decrease pain in LUE to 3/10 or less to improve ability to sleep for 2+ consecutive hours without waking due to pain.   Goal status: IN PROGRESS  2.  Pt will decrease LUE fascial restrictions to min amounts or less to improve mobility required for functional reaching  tasks.   Goal status: IN PROGRESS  3.  Pt will increase LUE A/ROM by at least 25 degrees to improve ability to use LUE when reaching overhead or behind back during dressing and bathing tasks.   Goal status: IN PROGRESS  4.  Pt will increase LUE strength to 4/5 or greater to improve ability to use LUE when lifting or carrying items during meal preparation/housework/yardwork tasks.   Goal status: IN PROGRESS  5.  Pt will return to highest level of function using LUE as dominant during functional task completion.   Goal status: IN PROGRESS      ASSESSMENT:  CLINICAL IMPRESSION: Pt continuing to make incremental progress with ROM and strength. He continues to be concerned with his ROM of his wrist extension, so OT added weighted extension stretches. He continued to work on wrist and pinch strengthening with increasing weight of dumbbell to 3lbs, except with wrist extension, where we stayed with 2lbs. During pinch strengthening, pt and OT focused on D4 and D5, as pt reports they are they weakest and most frustrating fingers. OT providing verbal and tactile cuing this session for positioning and technique, as well as attention to the tasks.    PERFORMANCE DEFICITS: in functional skills including ADLs, IADLs, coordination, dexterity, proprioception, sensation, edema, tone, ROM, strength, pain, fascial restrictions, Fine motor control, endurance, and UE functional use, cognitive skills including attention, emotional, and thought, and psychosocial skills including coping strategies.    PLAN:  OT FREQUENCY: 2x/week  OT DURATION: 4 weeks  PLANNED INTERVENTIONS: self care/ADL training, therapeutic exercise, therapeutic activity, neuromuscular re-education, manual therapy, passive range of motion, splinting,  electrical stimulation, paraffin, patient/family education, and DME and/or AE instructions  RECOMMENDED OTHER SERVICES: follow up with MD regarding sleep  CONSULTED AND AGREED WITH PLAN  OF CARE: Patient  PLAN FOR NEXT SESSION: Follow up on HEP,  wrist strengthening using low weight, grip and pinch strengthening, coordination tasks   Paulita Fujita, OTR/L [phone] 10/11/2022, 9:10 AM

## 2022-10-13 ENCOUNTER — Ambulatory Visit (HOSPITAL_COMMUNITY): Payer: BLUE CROSS/BLUE SHIELD | Admitting: Occupational Therapy

## 2022-10-13 ENCOUNTER — Encounter (HOSPITAL_COMMUNITY): Payer: Self-pay | Admitting: Occupational Therapy

## 2022-10-13 DIAGNOSIS — R29818 Other symptoms and signs involving the nervous system: Secondary | ICD-10-CM

## 2022-10-13 DIAGNOSIS — M25532 Pain in left wrist: Secondary | ICD-10-CM | POA: Diagnosis not present

## 2022-10-13 DIAGNOSIS — M25632 Stiffness of left wrist, not elsewhere classified: Secondary | ICD-10-CM

## 2022-10-13 NOTE — Therapy (Signed)
OUTPATIENT OCCUPATIONAL THERAPY ORTHO TREATMENT NOTE and REASSESSMENT  Patient Name: Dalton Guerrero MRN: [id number] DOB:11-07-1960, 62 y.o., male Today's Date: 10/13/2022  PCP: Dr. Modena Morrow REFERRING PROVIDER: [name], PA-C  Progress Note Reporting Period 07/12/23 to 10/13/22  See note below for Objective Data and Assessment of Progress/Goals.    END OF SESSION:  OT End of Session - 10/13/22 0821     Visit Number 19    Number of Visits 20    Date for OT Re-Evaluation 10/15/22    Authorization Type [employer]    Progress Note Due on Visit 63    OT Start Time 0820    OT Stop Time 0900    OT Time Calculation (min) 40 min    Activity Tolerance Patient tolerated treatment well    Behavior During Therapy WFL for tasks assessed/performed             Past Medical History:  Diagnosis Date   Hypertension    Past Surgical History:  Procedure Laterality Date   KIDNEY SURGERY Bilateral    Pt. 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   ONSET DATE: 02/28/22  REFERRING DIAG: right RC tear, left radial nerve palsy, left wrist contracture  THERAPY DIAG:  Pain in left wrist  Stiffness of left wrist, not elsewhere classified  Other symptoms and signs involving the nervous system  Rationale for Evaluation and Treatment: Rehabilitation  SUBJECTIVE:   SUBJECTIVE STATEMENT: S: "My hand always feels cold."   PERTINENT HISTORY: Pt is a 62 y/o male presenting with right RC tear, left radial nerve palsy, and left wrist contracture. Pt presents in a standard prefabricated wrist splint today, minimal benefit to wrist and left hand. Pt fell several years ago landing on his right shoulder, then had surgery. In March, pt fell at work when tripping over a mat, then later tried to lift a box overhead and sustained a RC tear. Pt was scheduled for a reverse TSA, but has postponed due to the severity of deficits with his left hand and wrist.  Pt has had a nerve conduction study completed, pt reports he was told that it would be 6 months before he saw improvement. Currently, the pt's primary concern is his left hand and wrist, this is where he would like therapy to focus.   PRECAUTIONS: None  WEIGHT BEARING RESTRICTIONS: No  PAIN:  Are you having pain? Yes: NPRS scale: 5/10 Pain location: right shoulder and L wrist Pain description: aching, sore Aggravating factors: movement, use, lifting Relieving factors: nothing  FALLS: Has patient fallen in last 6 months? Yes. Number of falls 2  PATIENT GOALS: To get his left hand working again.   NEXT MD VISIT: unsure  OBJECTIVE:   HAND DOMINANCE: Left  ADLs: Overall ADLs: Pt reports he can wash the left side of his body and he can lift a lightweight milk jug with his left hand, otherwise he is unable to use his left hand functionally. He has been soaking his hand in epsom salts and hot water to stretch it.   FUNCTIONAL OUTCOME MEASURES: Quick Dash: 81.82 Quick Dash: (09/04/22): 59.09  UPPER EXTREMITY ROM:     Active ROM Left eval Left 08/31/22 Left 09/11/22 Left 10/13/22  Elbow flexion 130 140  140  Elbow extension 0 0  0  Wrist flexion 50 51 50 52  Wrist extension 0 16 17 24   Wrist ulnar deviation 0 24 38 38  Wrist radial deviation 0 6 15 16   Wrist pronation 45 WFL 90 WFL  Wrist supination 90 WFL 90 WFL  (Blank rows = not tested)  Active ROM Left eval Left 08/31/22 Left 09/11/22 Left  10/13/22  Thumb MCP (0-60) 72 65  70  Thumb IP (0-80) 21 50  55  Thumb Opposition to Small Finger  0 Able  Able  Index MCP (0-90)  72 65  60  Index PIP (0-100)  62 95  90  Index DIP (0-70)  30 60  55  Long MCP (0-90)  68 75  80  Long PIP (0-100)  62 85  90  Long DIP (0-70)  55 55  55  Ring MCP (0-90)  58 85  80  Ring PIP (0-100)  82 85  90  Ring DIP (0-70)  40 55  55  Little MCP (0-90)  74 85  85  Little PIP (0-100)  58 80  80  Little DIP (0-70)  45 55  55  (Blank rows = not  tested)    -09/11/22: Pt is able to make a full fist now.   UPPER EXTREMITY MMT:     MMT Left eval Left 08/31/22 Left 09/11/22 Left 10/13/22  Elbow flexion 5/5 5/5  5/5  Elbow extension 5/5 5/5  5/5  Wrist flexion 3-/5 4+/5 4+/5 5/5  Wrist extension 1/5 3/5 4-/5 4+/5  Wrist ulnar deviation 0/5 4-/5 4-/5 4+/5  Wrist radial deviation 0/5 3+/5 4-/5 4/5  Wrist pronation 2/5 3+/5 5/5 5/5  Wrist supination 3-/5 4/5 5/5 4+/5  (Blank rows = not tested)  HAND FUNCTION: Grip strength: Right: 68 lbs; Left: 10 lbs, Lateral pinch: Right: 13 lbs, Left: 6 lbs, and 3 point pinch: Right: 14 lbs, Left: 1 lbs Grip strength: (08/31/22)Left: 20 lbs, Lateral pinch: Left: 8 lbs, and 3 point pinch: Left: 9 lbs Grip strength: (09/11/22)Left: 24 lbs, Lateral pinch: Left: 9 lbs, and 3 point pinch: Left: 9 lbs Grip strength: (10/13/22)Left: 26 lbs, Lateral pinch: Left: 10 lbs, and 3 point pinch: Left: 10 lbs  COORDINATION: Unable to complete due to deficits 08/31/22: 9 Hole Peg Test: Right: 23.15 sec; Left: 41.42 sec 09/11/22: 9 Hole Peg Test: Left: 29.04" sec 10/13/22: 9 Hole Peg Test: Left: 29.04" sec  SENSATION: Light touch: WFL  EDEMA: None noted               08/31/22: mild swelling along the dorsal aspect of the hand  COGNITION: Overall cognitive status: Difficulty to assess due to: lack of sleep, emotional, tangential   TODAY'S TREATMENT:                                                                                                                              DATE:  10/13/22 -ball rolls: flexion/extension, supination/pronation, ulnar/radial deviation, yellow weighted ball, x10 -Measurements for reassessment -Weighted ball ABC's -Digit ROM: digit composite flexion, abduction/adduction, opposition, finger taps, x15  10/11/22 -Wrist  ROM: flexion/extension, ulnar/radial deviation, supination/pronation, x15 -Wrist Strengthening: flexion (3lbs), extension (2lb), ulnar/radial deviation (3lb),  supination/pronation (3lb), x15 -Wrist extension stretch on foam roll with 4lb dumbbell, 2x30" -Pinch strengthening: D4 and D5 pinch,   10/03/22 -Digit ROM: digit composite flexion, abduction/adduction, opposition, finger taps, x15 -Towel scrunches Q000111Q -Flicking: x6 foam blocks each digit -Digiflex: 3lbs, x10 full squeeze, x12 each digit flex -Gripper: 25lbs, x10 medium beads in vertical -Pinch Strengthening: Green resistance clips, picking up large pegs and completing peg board pattern.   PATIENT EDUCATION: Education details: Weighted ABC's in the air Person educated: Patient Education method: Explanation, Demonstration, and Handouts Education comprehension: verbalized understanding and returned demonstration  HOME EXERCISE PROGRAM: Eval: weightbearing 1/5: Radial Nerve Tendon Glide 1/19: Digit ROM 1/30: weighted wrist stretches 2/1: Theraputty Exercise 2/8: Wrist A/ROM 3/8: Coin Exercises Q000111Q: flicking cotton balls 3/27: Weight wrist extension stretch 3/29: weight ABC's in the air   GOALS: Goals reviewed with patient? Yes  SHORT TERM GOALS: Target date: 08/08/22  Pt will be provided with and educated on HEP to improve mobility in LUE required for use during ADL completion.   Goal status: MET  2.  Pt will be provided with and educated on splint wear and care for radial nerve palsy, demonstrating independence in donning and doffing.   Goal status: MET  3.  Pt will increase left wrist strength to 3+/5 to improve ability to reach for items using his left hand and maintain a light grasp.   Goal status: MET  4.  Pt will increase left grip strength by 5# and pinch strength by 2# to improve ability to grasp and maintain hold on tools for ADL completion such as a cup, toothbrush, clothing, etc.   Goal status: MET    LONG TERM GOALS: Target date: 12/01/22  Pt will decrease pain in LUE to 3/10 or less to improve ability to sleep for 2+ consecutive hours without waking  due to pain.   Goal status: IN PROGRESS  2.  Pt will decrease LUE fascial restrictions to min amounts or less to improve mobility required for functional reaching tasks.   Goal status: MET  3.  Pt will increase LUE A/ROM by at least 25 degrees to improve ability to use LUE when reaching overhead or behind back during dressing and bathing tasks.   Goal status: IN PROGRESS  4.  Pt will increase LUE strength to 5/5 or greater to improve ability to use LUE when lifting or carrying items during meal preparation/housework/yardwork tasks.   Goal status: REVISED  5.  Pt will return to highest level of function using LUE as dominant during functional task completion.   Goal status: IN PROGRESS      ASSESSMENT:  CLINICAL IMPRESSION: Pt was seen for reassessment this session, where he is demonstrating incremental improvement. This session OT had pt remove his brace during the day due to having improved wrist extension and stability. He reports that he does not feel that his hand is improving because he feels like he still can not handle objects like he should. The rest of the session focused on functional wrist movement with weight to simulate lifting and manipulating household objects. OT providing education on importance completing exercises, reducing use of splint to continue working wrist to strengthen it functionally, and cuing for positioning and technique.    PERFORMANCE DEFICITS: in functional skills including ADLs, IADLs, coordination, dexterity, proprioception, sensation, edema, tone, ROM, strength, pain, fascial restrictions, Fine motor control, endurance, and UE functional  use, cognitive skills including attention, emotional, and thought, and psychosocial skills including coping strategies.    PLAN:  OT FREQUENCY: 2x/week  OT DURATION: 4 weeks  PLANNED INTERVENTIONS: self care/ADL training, therapeutic exercise, therapeutic activity, neuromuscular re-education, manual therapy,  passive range of motion, splinting, electrical stimulation, paraffin, patient/family education, and DME and/or AE instructions  RECOMMENDED OTHER SERVICES: follow up with MD regarding sleep  CONSULTED AND AGREED WITH PLAN OF CARE: Patient  PLAN FOR NEXT SESSION: Follow up on HEP,  wrist strengthening using low weight, grip and pinch strengthening, coordination tasks   Paulita Fujita, OTR/L [phone] 10/13/2022, 8:22 AM

## 2022-10-16 ENCOUNTER — Ambulatory Visit (HOSPITAL_COMMUNITY): Payer: BLUE CROSS/BLUE SHIELD | Attending: Family Medicine | Admitting: Occupational Therapy

## 2022-10-16 ENCOUNTER — Encounter (HOSPITAL_COMMUNITY): Payer: Self-pay | Admitting: Occupational Therapy

## 2022-10-16 DIAGNOSIS — R29818 Other symptoms and signs involving the nervous system: Secondary | ICD-10-CM | POA: Diagnosis present

## 2022-10-16 DIAGNOSIS — M25532 Pain in left wrist: Secondary | ICD-10-CM | POA: Diagnosis present

## 2022-10-16 DIAGNOSIS — M25632 Stiffness of left wrist, not elsewhere classified: Secondary | ICD-10-CM | POA: Insufficient documentation

## 2022-10-16 NOTE — Therapy (Signed)
OUTPATIENT OCCUPATIONAL THERAPY ORTHO TREATMENT NOTE and REASSESSMENT  Patient Name: Dalton Guerrero MRN: [id number] DOB:22-May-1961, 62 y.o., male Today's Date: 10/16/2022  PCP: Dr. Modena Morrow REFERRING PROVIDER: [name], PA-C  Progress Note Reporting Period 07/12/23 to 10/13/22  See note below for Objective Data and Assessment of Progress/Goals.    END OF SESSION:  OT End of Session - 10/16/22 0818     Visit Number 20    Number of Visits 25    Date for OT Re-Evaluation 11/24/22    Authorization Type [employer]    Progress Note Due on Visit 40    OT Start Time 0820    OT Stop Time 0900    OT Time Calculation (min) 40 min    Activity Tolerance Patient tolerated treatment well    Behavior During Therapy WFL for tasks assessed/performed             Past Medical History:  Diagnosis Date   Hypertension    Past Surgical History:  Procedure Laterality Date   KIDNEY SURGERY Bilateral    Pt. 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   ONSET DATE: 02/28/22  REFERRING DIAG: right RC tear, left radial nerve palsy, left wrist contracture  THERAPY DIAG:  Pain in left wrist  Stiffness of left wrist, not elsewhere classified  Other symptoms and signs involving the nervous system  Rationale for Evaluation and Treatment: Rehabilitation  SUBJECTIVE:   SUBJECTIVE STATEMENT: S: "It's swelling up again and was hurting last night"   PERTINENT HISTORY: Pt is a 62 y/o male presenting with right RC tear, left radial nerve palsy, and left wrist contracture. Pt presents in a standard prefabricated wrist splint today, minimal benefit to wrist and left hand. Pt fell several years ago landing on his right shoulder, then had surgery. In March, pt fell at work when tripping over a mat, then later tried to lift a box overhead and sustained a RC tear. Pt was scheduled for a reverse TSA, but has postponed due to the severity of deficits with his  left hand and wrist. Pt has had a nerve conduction study completed, pt reports he was told that it would be 6 months before he saw improvement. Currently, the pt's primary concern is his left hand and wrist, this is where he would like therapy to focus.   PRECAUTIONS: None  WEIGHT BEARING RESTRICTIONS: No  PAIN:  Are you having pain? Yes: NPRS scale: 3/10 Pain location: right shoulder and L wrist Pain description: aching, sore Aggravating factors: movement, use, lifting Relieving factors: nothing  FALLS: Has patient fallen in last 6 months? Yes. Number of falls 2  PATIENT GOALS: To get his left hand working again.   NEXT MD VISIT: unsure  OBJECTIVE:   HAND DOMINANCE: Left  ADLs: Overall ADLs: Pt reports he can wash the left side of his body and he can lift a lightweight milk jug with his left hand, otherwise he is unable to use his left hand functionally. He has been soaking his hand in epsom salts and hot water to stretch it.   FUNCTIONAL OUTCOME MEASURES: Quick Dash: 81.82 Quick Dash: (09/04/22): 59.09  UPPER EXTREMITY ROM:     Active ROM Left eval Left 08/31/22 Left 09/11/22 Left 10/13/22  Elbow flexion 130 140  140  Elbow extension 0 0  0  Wrist flexion 50 51 50 52  Wrist extension 0 16 17 24   Wrist ulnar deviation 0  24 38 38  Wrist radial deviation 0 6 15 16   Wrist pronation 45 WFL 90 WFL  Wrist supination 90 WFL 90 WFL  (Blank rows = not tested)  Active ROM Left eval Left 08/31/22 Left 09/11/22 Left  10/13/22  Thumb MCP (0-60) 72 65  70  Thumb IP (0-80) 21 50  55  Thumb Opposition to Small Finger  0 Able  Able  Index MCP (0-90)  72 65  60  Index PIP (0-100)  62 95  90  Index DIP (0-70)  30 60  55  Long MCP (0-90)  68 75  80  Long PIP (0-100)  62 85  90  Long DIP (0-70)  55 55  55  Ring MCP (0-90)  58 85  80  Ring PIP (0-100)  82 85  90  Ring DIP (0-70)  40 55  55  Little MCP (0-90)  74 85  85  Little PIP (0-100)  58 80  80  Little DIP (0-70)  45 55  55   (Blank rows = not tested)    -09/11/22: Pt is able to make a full fist now.   UPPER EXTREMITY MMT:     MMT Left eval Left 08/31/22 Left 09/11/22 Left 10/13/22  Elbow flexion 5/5 5/5  5/5  Elbow extension 5/5 5/5  5/5  Wrist flexion 3-/5 4+/5 4+/5 5/5  Wrist extension 1/5 3/5 4-/5 4+/5  Wrist ulnar deviation 0/5 4-/5 4-/5 4+/5  Wrist radial deviation 0/5 3+/5 4-/5 4/5  Wrist pronation 2/5 3+/5 5/5 5/5  Wrist supination 3-/5 4/5 5/5 4+/5  (Blank rows = not tested)  HAND FUNCTION: Grip strength: Right: 68 lbs; Left: 10 lbs, Lateral pinch: Right: 13 lbs, Left: 6 lbs, and 3 point pinch: Right: 14 lbs, Left: 1 lbs Grip strength: (08/31/22)Left: 20 lbs, Lateral pinch: Left: 8 lbs, and 3 point pinch: Left: 9 lbs Grip strength: (09/11/22)Left: 24 lbs, Lateral pinch: Left: 9 lbs, and 3 point pinch: Left: 9 lbs Grip strength: (10/13/22)Left: 26 lbs, Lateral pinch: Left: 10 lbs, and 3 point pinch: Left: 10 lbs  COORDINATION: Unable to complete due to deficits 08/31/22: 9 Hole Peg Test: Right: 23.15 sec; Left: 41.42 sec 09/11/22: 9 Hole Peg Test: Left: 29.04" sec 10/13/22: 9 Hole Peg Test: Left: 29.04" sec  SENSATION: Light touch: WFL  EDEMA: None noted               08/31/22: mild swelling along the dorsal aspect of the hand  COGNITION: Overall cognitive status: Difficulty to assess due to: lack of sleep, emotional, tangential   TODAY'S TREATMENT:                                                                                                                              DATE:  10/16/22 -ball rolls: flexion/extension, supination/pronation, ulnar/radial deviation, yellow weighted ball, x10 -Wrist ROM: flexion/extension, ulnar/radial deviation, supination/pronation, x15 -Weighted ball ABC's -Wrist Strengthening: flexion (3lbs), extension (  3lb), ulnar/radial deviation (3lb), supination/pronation (3lb), x15 -Theraputty: red putty, roll into a ball, flatten into pancake  10/13/22 -ball rolls:  flexion/extension, supination/pronation, ulnar/radial deviation, yellow weighted ball, x10 -Measurements for reassessment -Weighted ball ABC's -Digit ROM: digit composite flexion, abduction/adduction, opposition, finger taps, x15  10/11/22 -Wrist ROM: flexion/extension, ulnar/radial deviation, supination/pronation, x15 -Wrist Strengthening: flexion (3lbs), extension (2lb), ulnar/radial deviation (3lb), supination/pronation (3lb), x15 -Wrist extension stretch on foam roll with 4lb dumbbell, 2x30" -Pinch strengthening: D4 and D5 pinch,     PATIENT EDUCATION: Education details: Reviewed HEP Person educated: Patient Education method: Explanation, Demonstration, and Handouts Education comprehension: verbalized understanding and returned demonstration  HOME EXERCISE PROGRAM: Eval: weightbearing 1/5: Radial Nerve Tendon Glide 1/19: Digit ROM 1/30: weighted wrist stretches 2/1: Theraputty Exercise 2/8: Wrist A/ROM 3/8: Coin Exercises [id number]: flicking cotton balls 3/27: Weight wrist extension stretch 3/29: weight ABC's in the air   GOALS: Goals reviewed with patient? Yes  SHORT TERM GOALS: Target date: 08/08/22  Pt will be provided with and educated on HEP to improve mobility in LUE required for use during ADL completion.   Goal status: MET  2.  Pt will be provided with and educated on splint wear and care for radial nerve palsy, demonstrating independence in donning and doffing.   Goal status: MET  3.  Pt will increase left wrist strength to 3+/5 to improve ability to reach for items using his left hand and maintain a light grasp.   Goal status: MET  4.  Pt will increase left grip strength by 5# and pinch strength by 2# to improve ability to grasp and maintain hold on tools for ADL completion such as a cup, toothbrush, clothing, etc.   Goal status: MET    LONG TERM GOALS: Target date: 12/01/22  Pt will decrease pain in LUE to 3/10 or less to improve ability to sleep for  2+ consecutive hours without waking due to pain.   Goal status: IN PROGRESS  2.  Pt will decrease LUE fascial restrictions to min amounts or less to improve mobility required for functional reaching tasks.   Goal status: MET  3.  Pt will increase LUE A/ROM by at least 25 degrees to improve ability to use LUE when reaching overhead or behind back during dressing and bathing tasks.   Goal status: IN PROGRESS  4.  Pt will increase LUE strength to 5/5 or greater to improve ability to use LUE when lifting or carrying items during meal preparation/housework/yardwork tasks.   Goal status: REVISED  5.  Pt will return to highest level of function using LUE as dominant during functional task completion.   Goal status: IN PROGRESS      ASSESSMENT:  CLINICAL IMPRESSION: Pt continuing to work on ROM, strength, and overall mobility this session. His motor planning and control are improving with cuing and increased time to focus on each movement. He feels that pain is improving, however this session he has some mild swelling in the dorsal aspect of his hand again, which had not been present the past few session until today. OT providing verbal and tactile cuing for positioning, technique, and attention to tasks throughout the session.    PERFORMANCE DEFICITS: in functional skills including ADLs, IADLs, coordination, dexterity, proprioception, sensation, edema, tone, ROM, strength, pain, fascial restrictions, Fine motor control, endurance, and UE functional use, cognitive skills including attention, emotional, and thought, and psychosocial skills including coping strategies.    PLAN:  OT FREQUENCY: 1x/week  OT DURATION: 6 weeks  PLANNED INTERVENTIONS: self care/ADL training, therapeutic exercise, therapeutic activity, neuromuscular re-education, manual therapy, passive range of motion, splinting, electrical stimulation, paraffin, patient/family education, and DME and/or AE  instructions  RECOMMENDED OTHER SERVICES: follow up with MD regarding sleep  CONSULTED AND AGREED WITH PLAN OF CARE: Patient  PLAN FOR NEXT SESSION: Follow up on HEP,  wrist strengthening using low weight, grip and pinch strengthening, coordination tasks   Paulita Fujita, OTR/L [phone] 10/16/2022, 8:20 AM

## 2022-10-16 NOTE — Addendum Note (Signed)
Addended by: Paulita Fujita E on: 10/16/2022 01:04 PM   Modules accepted: Orders

## 2022-10-27 ENCOUNTER — Ambulatory Visit (HOSPITAL_COMMUNITY): Payer: BLUE CROSS/BLUE SHIELD | Admitting: Occupational Therapy

## 2022-10-27 ENCOUNTER — Encounter (HOSPITAL_COMMUNITY): Payer: Self-pay | Admitting: Occupational Therapy

## 2022-10-27 DIAGNOSIS — R29818 Other symptoms and signs involving the nervous system: Secondary | ICD-10-CM

## 2022-10-27 DIAGNOSIS — M25532 Pain in left wrist: Secondary | ICD-10-CM | POA: Diagnosis not present

## 2022-10-27 DIAGNOSIS — M25632 Stiffness of left wrist, not elsewhere classified: Secondary | ICD-10-CM

## 2022-10-27 NOTE — Therapy (Unsigned)
OUTPATIENT OCCUPATIONAL THERAPY ORTHO TREATMENT NOTE and REASSESSMENT  Patient Name: Dalton Guerrero MRN: [id number] DOB:08/26/1960, 62 y.o., male Today's Date: 10/27/2022  PCP: Dr. Gwen Pounds REFERRING PROVIDER: Bethann Berkshire, PA-C  Progress Note Reporting Period 07/12/23 to 10/13/22  See note below for Objective Data and Assessment of Progress/Goals.    END OF SESSION:  OT End of Session - 10/27/22 0819     Visit Number 21    Number of Visits 25    Date for OT Re-Evaluation 11/24/22    Authorization Type [employer]    Progress Note Due on Visit 20    OT Start Time [phone]    OT Stop Time 0900    OT Time Calculation (min) 43 min    Activity Tolerance Patient tolerated treatment well    Behavior During Therapy WFL for tasks assessed/performed             Past Medical History:  Diagnosis Date   Hypertension    Past Surgical History:  Procedure Laterality Date   KIDNEY SURGERY Bilateral    Pt. 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   ONSET DATE: 02/28/22  REFERRING DIAG: right RC tear, left radial nerve palsy, left wrist contracture  THERAPY DIAG:  Pain in left wrist  Stiffness of left wrist, not elsewhere classified  Other symptoms and signs involving the nervous system  Rationale for Evaluation and Treatment: Rehabilitation  SUBJECTIVE:   SUBJECTIVE STATEMENT: S: "I've been exercising as much as I possibly can."   PERTINENT HISTORY: Pt is a 62 y/o male presenting with right RC tear, left radial nerve palsy, and left wrist contracture. Pt presents in a standard prefabricated wrist splint today, minimal benefit to wrist and left hand. Pt fell several years ago landing on his right shoulder, then had surgery. In March, pt fell at work when tripping over a mat, then later tried to lift a box overhead and sustained a RC tear. Pt was scheduled for a reverse TSA, but has postponed due to the severity of deficits with his  left hand and wrist. Pt has had a nerve conduction study completed, pt reports he was told that it would be 6 months before he saw improvement. Currently, the pt's primary concern is his left hand and wrist, this is where he would like therapy to focus.   PRECAUTIONS: None  WEIGHT BEARING RESTRICTIONS: No  PAIN:  Are you having pain? Yes: NPRS scale: 1/10 Pain location: right shoulder and L wrist Pain description: aching, sore Aggravating factors: movement, use, lifting Relieving factors: nothing  FALLS: Has patient fallen in last 6 months? Yes. Number of falls 2  PATIENT GOALS: To get his left hand working again.   NEXT MD VISIT: unsure  OBJECTIVE:   HAND DOMINANCE: Left  ADLs: Overall ADLs: Pt reports he can wash the left side of his body and he can lift a lightweight milk jug with his left hand, otherwise he is unable to use his left hand functionally. He has been soaking his hand in epsom salts and hot water to stretch it.   FUNCTIONAL OUTCOME MEASURES: Quick Dash: 81.82 Quick Dash: (09/04/22): 59.09  UPPER EXTREMITY ROM:     Active ROM Left eval Left 08/31/22 Left 09/11/22 Left 10/13/22  Elbow flexion 130 140  140  Elbow extension 0 0  0  Wrist flexion 50 51 50 52  Wrist extension 0 16 17 24   Wrist ulnar deviation 0  24 38 38  Wrist radial deviation 0 Wrist pronation 45 WFL 90 WFL  Wrist supination 90 WFL 90 WFL  (Blank rows = not tested)  Active ROM Left eval Left 08/31/22 Left 09/11/22 Left  10/13/22  Thumb MCP (0-60) 72 65  70  Thumb IP (0-80) 21 50  55  Thumb Opposition to Small Finger  0 Able  Able  Index MCP (0-90)  72 65  60  Index PIP (0-100)  62 95  90  Index DIP (0-70)  30 60  55  Long MCP (0-90)  68 75  80  Long PIP (0-100)  62 85  90  Long DIP (0-70)  55 55  55  Ring MCP (0-90)  58 85  80  Ring PIP (0-100)  82 85  90  Ring DIP (0-70)  40 55  55  Little MCP (0-90)  74 85  85  Little PIP (0-100)  58 80  80  Little DIP (0-70)  45 55  55   (Blank rows = not tested)    -09/11/22: Pt is able to make a full fist now.   UPPER EXTREMITY MMT:     MMT Left eval Left 08/31/22 Left 09/11/22 Left 10/13/22  Elbow flexion 5/5 5/5  5/5  Elbow extension 5/5 5/5  5/5  Wrist flexion 3-/5 4+/5 4+/5 5/5  Wrist extension 1/5 3/5 4-/5 4+/5  Wrist ulnar deviation 0/5 4-/5 4-/5 4+/5  Wrist radial deviation 0/5 3+/5 4-/5 4/5  Wrist pronation 2/5 3+/5 5/5 5/5  Wrist supination 3-/5 4/5 5/5 4+/5  (Blank rows = not tested)  HAND FUNCTION: Grip strength: Right: 68 lbs; Left: 10 lbs, Lateral pinch: Right: 13 lbs, Left: 6 lbs, and 3 point pinch: Right: 14 lbs, Left: 1 lbs Grip strength: (08/31/22)Left: 20 lbs, Lateral pinch: Left: 8 lbs, and 3 point pinch: Left: 9 lbs Grip strength: (09/11/22)Left: 24 lbs, Lateral pinch: Left: 9 lbs, and 3 point pinch: Left: 9 lbs Grip strength: (10/13/22)Left: 26 lbs, Lateral pinch: Left: 10 lbs, and 3 point pinch: Left: 10 lbs  COORDINATION: Unable to complete due to deficits 08/31/22: 9 Hole Peg Test: Right: 23.15 sec; Left: 41.42 sec 09/11/22: 9 Hole Peg Test: Left: 29.04" sec 10/13/22: 9 Hole Peg Test: Left: 29.04" sec  SENSATION: Light touch: WFL  EDEMA: None noted               08/31/22: mild swelling along the dorsal aspect of the hand  COGNITION: Overall cognitive status: Difficulty to assess due to: lack of sleep, emotional, tangential   TODAY'S TREATMENT:                                                                                                                              DATE:  10/27/22 -Theraputty: green putty, roll into a ball, squeeze x10, flatten into pancake, pull apart, use PVC pipe to cut cookies, roll out putty flat, roll  into log, D3-D5 pinching, roll into ball -Pinch Strengthening: green resistance clip D1 to D2/D3 pinch stacking 6 cubes, red resistance clip D1-D3/D4 pinch stacking 6 cubes, red resistance clip D1 to D4/D5 pinch stacking 3 cubes -Screws: Large/Long screws x5,  large/medium screws x5, Large/short x5, Small/medium x5 -Weighted ball wrist on fire - green weight ball  10/16/22 -ball rolls: flexion/extension, supination/pronation, ulnar/radial deviation, yellow weighted ball, x10 -Wrist ROM: flexion/extension, ulnar/radial deviation, supination/pronation, x15 -Weighted ball ABC's -Wrist Strengthening: flexion (3lbs), extension (3lb), ulnar/radial deviation (3lb), supination/pronation (3lb), x15 -Theraputty: red putty, roll into a ball, flatten into pancake  10/13/22 -ball rolls: flexion/extension, supination/pronation, ulnar/radial deviation, yellow weighted ball, x10 -Measurements for reassessment -Weighted ball ABC's -Digit ROM: digit composite flexion, abduction/adduction, opposition, finger taps, x15  10/11/22 -Wrist ROM: flexion/extension, ulnar/radial deviation, supination/pronation, x15 -Wrist Strengthening: flexion (3lbs), extension (2lb), ulnar/radial deviation (3lb), supination/pronation (3lb), x15 -Wrist extension stretch on foam roll with 4lb dumbbell, 2x30" -Pinch strengthening: D4 and D5 pinch,     PATIENT EDUCATION: Education details: Reviewed HEP Person educated: Patient Education method: Explanation, Demonstration, and Handouts Education comprehension: verbalized understanding and returned demonstration  HOME EXERCISE PROGRAM: Eval: weightbearing 1/5: Radial Nerve Tendon Glide 1/19: Digit ROM 1/30: weighted wrist stretches 2/1: Theraputty Exercise 2/8: Wrist A/ROM 3/8: Coin Exercises 3/11: flicking cotton balls 3/27: Weight wrist extension stretch 3/29: weight ABC's in the air   GOALS: Goals reviewed with patient? Yes  SHORT TERM GOALS: Target date: 08/08/22  Pt will be provided with and educated on HEP to improve mobility in LUE required for use during ADL completion.   Goal status: MET  2.  Pt will be provided with and educated on splint wear and care for radial nerve palsy, demonstrating independence in  donning and doffing.   Goal status: MET  3.  Pt will increase left wrist strength to 3+/5 to improve ability to reach for items using his left hand and maintain a light grasp.   Goal status: MET  4.  Pt will increase left grip strength by 5# and pinch strength by 2# to improve ability to grasp and maintain hold on tools for ADL completion such as a cup, toothbrush, clothing, etc.   Goal status: MET    LONG TERM GOALS: Target date: 12/01/22  Pt will decrease pain in LUE to 3/10 or less to improve ability to sleep for 2+ consecutive hours without waking due to pain.   Goal status: IN PROGRESS  2.  Pt will decrease LUE fascial restrictions to min amounts or less to improve mobility required for functional reaching tasks.   Goal status: MET  3.  Pt will increase LUE A/ROM by at least 25 degrees to improve ability to use LUE when reaching overhead or behind back during dressing and bathing tasks.   Goal status: IN PROGRESS  4.  Pt will increase LUE strength to 5/5 or greater to improve ability to use LUE when lifting or carrying items during meal preparation/housework/yardwork tasks.   Goal status: REVISED  5.  Pt will return to highest level of function using LUE as dominant during functional task completion.   Goal status: IN PROGRESS      ASSESSMENT:  CLINICAL IMPRESSION: Pt continuing to work on ROM, strength, and overall mobility this session. His motor planning and control are improving with cuing and increased time to focus on each movement. He feels that pain is improving, however this session he has some mild swelling in the dorsal aspect of his hand again,  which had not been present the past few session until today. OT providing verbal and tactile cuing for positioning, technique, and attention to tasks throughout the session.    PERFORMANCE DEFICITS: in functional skills including ADLs, IADLs, coordination, dexterity, proprioception, sensation, edema, tone, ROM,  strength, pain, fascial restrictions, Fine motor control, endurance, and UE functional use, cognitive skills including attention, emotional, and thought, and psychosocial skills including coping strategies.    PLAN:  OT FREQUENCY: 1x/week  OT DURATION: 6 weeks  PLANNED INTERVENTIONS: self care/ADL training, therapeutic exercise, therapeutic activity, neuromuscular re-education, manual therapy, passive range of motion, splinting, electrical stimulation, paraffin, patient/family education, and DME and/or AE instructions  RECOMMENDED OTHER SERVICES: follow up with MD regarding sleep  CONSULTED AND AGREED WITH PLAN OF CARE: Patient  PLAN FOR NEXT SESSION: Follow up on HEP,  wrist strengthening using low weight, grip and pinch strengthening, coordination tasks   Trish Mage, OTR/L [phone] 10/27/2022, 8:20 AM

## 2022-11-03 ENCOUNTER — Ambulatory Visit (HOSPITAL_COMMUNITY): Payer: BLUE CROSS/BLUE SHIELD | Admitting: Occupational Therapy

## 2022-11-03 ENCOUNTER — Encounter (HOSPITAL_COMMUNITY): Payer: Self-pay | Admitting: Occupational Therapy

## 2022-11-03 DIAGNOSIS — M25532 Pain in left wrist: Secondary | ICD-10-CM

## 2022-11-03 DIAGNOSIS — M25632 Stiffness of left wrist, not elsewhere classified: Secondary | ICD-10-CM

## 2022-11-03 DIAGNOSIS — R29818 Other symptoms and signs involving the nervous system: Secondary | ICD-10-CM

## 2022-11-03 NOTE — Therapy (Signed)
OUTPATIENT OCCUPATIONAL THERAPY ORTHO TREATMENT NOTE   Patient Name: Dalton Guerrero MRN: [id number] DOB:01/06/1961, 62 y.o., male Today's Date: 11/03/2022  PCP: Dr. Gwen Pounds REFERRING PROVIDER: Bethann Berkshire, PA-C   END OF SESSION:  OT End of Session - 11/03/22 0906     Visit Number 22    Number of Visits 25    Date for OT Re-Evaluation 11/24/22    Authorization Type [employer]    Progress Note Due on Visit 20    OT Start Time 0903    OT Stop Time 0945    OT Time Calculation (min) 42 min    Activity Tolerance Patient tolerated treatment well    Behavior During Therapy WFL for tasks assessed/performed             Past Medical History:  Diagnosis Date   Hypertension    Past Surgical History:  Procedure Laterality Date   KIDNEY SURGERY Bilateral    Pt. 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   ONSET DATE: 02/28/22  REFERRING DIAG: right RC tear, left radial nerve palsy, left wrist contracture  THERAPY DIAG:  Pain in left wrist  Stiffness of left wrist, not elsewhere classified  Other symptoms and signs involving the nervous system  Rationale for Evaluation and Treatment: Rehabilitation  SUBJECTIVE:   SUBJECTIVE STATEMENT: S: "My whole body is sore today."   PERTINENT HISTORY: Pt is a 62 y/o male presenting with right RC tear, left radial nerve palsy, and left wrist contracture. Pt presents in a standard prefabricated wrist splint today, minimal benefit to wrist and left hand. Pt fell several years ago landing on his right shoulder, then had surgery. In March, pt fell at work when tripping over a mat, then later tried to lift a box overhead and sustained a RC tear. Pt was scheduled for a reverse TSA, but has postponed due to the severity of deficits with his left hand and wrist. Pt has had a nerve conduction study completed, pt reports he was told that it would be 6 months before he saw improvement. Currently, the pt's  primary concern is his left hand and wrist, this is where he would like therapy to focus.   PRECAUTIONS: None  WEIGHT BEARING RESTRICTIONS: No  PAIN:  Are you having pain? Yes: NPRS scale: 1/10 Pain location: right shoulder and L wrist Pain description: aching, sore Aggravating factors: movement, use, lifting Relieving factors: nothing  FALLS: Has patient fallen in last 6 months? Yes. Number of falls 2  PATIENT GOALS: To get his left hand working again.   NEXT MD VISIT: unsure  OBJECTIVE:   HAND DOMINANCE: Left  ADLs: Overall ADLs: Pt reports he can wash the left side of his body and he can lift a lightweight milk jug with his left hand, otherwise he is unable to use his left hand functionally. He has been soaking his hand in epsom salts and hot water to stretch it.   FUNCTIONAL OUTCOME MEASURES: Quick Dash: 81.82 Quick Dash: (09/04/22): 59.09  UPPER EXTREMITY ROM:     Active ROM Left eval Left 08/31/22 Left 09/11/22 Left 10/13/22  Elbow flexion 130 140  140  Elbow extension 0 0  0  Wrist flexion 50 51 50 52  Wrist extension 0 16 17 24   Wrist ulnar deviation 0 24 38 38  Wrist radial deviation 0 6 15 16   Wrist pronation 45 WFL 90 WFL  Wrist supination 90 WFL 90  WFL  (Blank rows = not tested)  Active ROM Left eval Left 08/31/22 Left 09/11/22 Left  10/13/22  Thumb MCP (0-60) 72 65  70  Thumb IP (0-80) 21 50  55  Thumb Opposition to Small Finger  0 Able  Able  Index MCP (0-90)  72 65  60  Index PIP (0-100)  62 95  90  Index DIP (0-70)  30 60  55  Long MCP (0-90)  68 75  80  Long PIP (0-100)  62 85  90  Long DIP (0-70)  55 55  55  Ring MCP (0-90)  58 85  80  Ring PIP (0-100)  82 85  90  Ring DIP (0-70)  40 55  55  Little MCP (0-90)  74 85  85  Little PIP (0-100)  58 80  80  Little DIP (0-70)  45 55  55  (Blank rows = not tested)    -09/11/22: Pt is able to make a full fist now.   UPPER EXTREMITY MMT:     MMT Left eval Left 08/31/22 Left 09/11/22  Left 10/13/22  Elbow flexion 5/5 5/5  5/5  Elbow extension 5/5 5/5  5/5  Wrist flexion 3-/5 4+/5 4+/5 5/5  Wrist extension 1/5 3/5 4-/5 4+/5  Wrist ulnar deviation 0/5 4-/5 4-/5 4+/5  Wrist radial deviation 0/5 3+/5 4-/5 4/5  Wrist pronation 2/5 3+/5 5/5 5/5  Wrist supination 3-/5 4/5 5/5 4+/5  (Blank rows = not tested)  HAND FUNCTION: Grip strength: Right: 68 lbs; Left: 10 lbs, Lateral pinch: Right: 13 lbs, Left: 6 lbs, and 3 point pinch: Right: 14 lbs, Left: 1 lbs Grip strength: (08/31/22)Left: 20 lbs, Lateral pinch: Left: 8 lbs, and 3 point pinch: Left: 9 lbs Grip strength: (09/11/22)Left: 24 lbs, Lateral pinch: Left: 9 lbs, and 3 point pinch: Left: 9 lbs Grip strength: (10/13/22)Left: 26 lbs, Lateral pinch: Left: 10 lbs, and 3 point pinch: Left: 10 lbs  COORDINATION: Unable to complete due to deficits 08/31/22: 9 Hole Peg Test: Right: 23.15 sec; Left: 41.42 sec 09/11/22: 9 Hole Peg Test: Left: 29.04" sec 10/13/22: 9 Hole Peg Test: Left: 29.04" sec  SENSATION: Light touch: WFL  EDEMA: None noted               08/31/22: mild swelling along the dorsal aspect of the hand  COGNITION: Overall cognitive status: Difficulty to assess due to: lack of sleep, emotional, tangential   TODAY'S TREATMENT:                                                                                                                              DATE:  11/03/22 -ball rolls: flexion/extension, supination/pronation, ulnar/radial deviation, blue weighted ball, x10 -Wrist ROM: flexion/extension, ulnar/radial deviation, supination/pronation, x15 -Wrist Strengthening: 3lb dumbbell, extension, flexion, ulnar/radial deviation, supination/pronation, x12 -Digiflex: 7lbs full squeeze x12, each finger squeeze x8 -Gripper: 22lb squeezes x10, 29lb squeezes x10 and picking up and stacking 6  cubes  10/27/22 -Theraputty: green putty, roll into a ball, squeeze x10, flatten into pancake, pull apart, use PVC pipe to cut cookies,  roll out putty flat, roll into log, D3-D5 pinching, roll into ball -Pinch Strengthening: green resistance clip D1 to D2/D3 pinch stacking 6 cubes, red resistance clip D1-D3/D4 pinch stacking 6 cubes, red resistance clip D1 to D4/D5 pinch stacking 3 cubes -Screws: Large/Long screws x5, large/medium screws x5, Large/short x5, Small/medium x5 -Weighted ball wrist on fire - green weight ball  10/16/22 -ball rolls: flexion/extension, supination/pronation, ulnar/radial deviation, yellow weighted ball, x10 -Wrist ROM: flexion/extension, ulnar/radial deviation, supination/pronation, x15 -Weighted ball ABC's -Wrist Strengthening: flexion (3lbs), extension (3lb), ulnar/radial deviation (3lb), supination/pronation (3lb), x15 -Theraputty: red putty, roll into a ball, flatten into pancake   PATIENT EDUCATION: Education details: Wrist strengthening with 3lb dumbbells Person educated: Patient Education method: Explanation, Demonstration, and Handouts Education comprehension: verbalized understanding and returned demonstration  HOME EXERCISE PROGRAM: Eval: weightbearing 1/5: Radial Nerve Tendon Glide 1/19: Digit ROM 1/30: weighted wrist stretches 2/1: Theraputty Exercise 2/8: Wrist A/ROM 3/8: Coin Exercises 3/11: flicking cotton balls 3/27: Weight wrist extension stretch 3/29: weight ABC's in the air 4/19: Wrist strengthening with 3lb dumbbells   GOALS: Goals reviewed with patient? Yes  SHORT TERM GOALS: Target date: 08/08/22  Pt will be provided with and educated on HEP to improve mobility in LUE required for use during ADL completion.   Goal status: MET  2.  Pt will be provided with and educated on splint wear and care for radial nerve palsy, demonstrating independence in donning and doffing.   Goal status: MET  3.  Pt will increase left wrist strength to 3+/5 to improve ability to reach for items using his left hand and maintain a light grasp.   Goal status: MET  4.  Pt will  increase left grip strength by 5# and pinch strength by 2# to improve ability to grasp and maintain hold on tools for ADL completion such as a cup, toothbrush, clothing, etc.   Goal status: MET    LONG TERM GOALS: Target date: 12/01/22  Pt will decrease pain in LUE to 3/10 or less to improve ability to sleep for 2+ consecutive hours without waking due to pain.   Goal status: IN PROGRESS  2.  Pt will decrease LUE fascial restrictions to min amounts or less to improve mobility required for functional reaching tasks.   Goal status: MET  3.  Pt will increase LUE A/ROM by at least 25 degrees to improve ability to use LUE when reaching overhead or behind back during dressing and bathing tasks.   Goal status: IN PROGRESS  4.  Pt will increase LUE strength to 5/5 or greater to improve ability to use LUE when lifting or carrying items during meal preparation/housework/yardwork tasks.   Goal status: REVISED  5.  Pt will return to highest level of function using LUE as dominant during functional task completion.   Goal status: IN PROGRESS      ASSESSMENT:  CLINICAL IMPRESSION: This session, pt continuing to make improvements with all wrist ROM and strengthening. He is tolerating 3lbs well with wrist strengthening while maintaining good movement pattern. Overall pt is able to control his wrist extension more with gripping and mobility tasks. Prior sessions pt would flex his wrist as he gripped items, now he is able to maintain wrist neutral positioning when he does slow and controlled movements. OT providing verbal and tactile cuing for positioning and technique throughout all exercises.  PERFORMANCE DEFICITS: in functional skills including ADLs, IADLs, coordination, dexterity, proprioception, sensation, edema, tone, ROM, strength, pain, fascial restrictions, Fine motor control, endurance, and UE functional use, cognitive skills including attention, emotional, and thought, and psychosocial  skills including coping strategies.    PLAN:  OT FREQUENCY: 1x/week  OT DURATION: 6 weeks  PLANNED INTERVENTIONS: self care/ADL training, therapeutic exercise, therapeutic activity, neuromuscular re-education, manual therapy, passive range of motion, splinting, electrical stimulation, paraffin, patient/family education, and DME and/or AE instructions  RECOMMENDED OTHER SERVICES: follow up with MD regarding sleep  CONSULTED AND AGREED WITH PLAN OF CARE: Patient  PLAN FOR NEXT SESSION: Follow up on HEP,  wrist strengthening using low weight, grip and pinch strengthening, coordination tasks   Trish Mage, OTR/L [phone] 11/03/2022, 9:07 AM

## 2022-11-10 ENCOUNTER — Telehealth (HOSPITAL_COMMUNITY): Payer: Self-pay | Admitting: Occupational Therapy

## 2022-11-10 ENCOUNTER — Encounter (HOSPITAL_COMMUNITY): Payer: Medicaid Other | Admitting: Occupational Therapy

## 2022-11-10 NOTE — Telephone Encounter (Signed)
Pt called following his missed OT visit on 11/10/22. Pt answered reporting that he over slept and this OT woke him up with her call. OT informed him of his next appointment and reminded him of the No Show Policy.   Trish Mage, OTR/L [facility] [phone]

## 2022-11-17 ENCOUNTER — Ambulatory Visit (HOSPITAL_COMMUNITY): Payer: BLUE CROSS/BLUE SHIELD | Attending: Family Medicine | Admitting: Occupational Therapy

## 2022-11-17 DIAGNOSIS — R29898 Other symptoms and signs involving the musculoskeletal system: Secondary | ICD-10-CM | POA: Diagnosis present

## 2022-11-17 DIAGNOSIS — M25632 Stiffness of left wrist, not elsewhere classified: Secondary | ICD-10-CM | POA: Diagnosis present

## 2022-11-17 DIAGNOSIS — R29818 Other symptoms and signs involving the nervous system: Secondary | ICD-10-CM | POA: Diagnosis present

## 2022-11-17 DIAGNOSIS — M25532 Pain in left wrist: Secondary | ICD-10-CM | POA: Diagnosis present

## 2022-11-17 NOTE — Therapy (Signed)
OUTPATIENT OCCUPATIONAL THERAPY ORTHO TREATMENT NOTE   Patient Name: Adelaido Kleinfeld MRN: [id number] DOB:1960-07-24, 62 y.o., male Today's Date: 11/17/2022  PCP: Dr. Gwen Pounds REFERRING PROVIDER: Bethann Berkshire, PA-C   END OF SESSION:  OT End of Session - 11/17/22 0817     Visit Number 23    Number of Visits 25    Date for OT Re-Evaluation 11/24/22    Authorization Type [employer]    Progress Note Due on Visit 20    OT Start Time 0815    OT Stop Time 0900    OT Time Calculation (min) 45 min    Activity Tolerance Patient tolerated treatment well    Behavior During Therapy WFL for tasks assessed/performed             Past Medical History:  Diagnosis Date   Hypertension    Past Surgical History:  Procedure Laterality Date   KIDNEY SURGERY Bilateral    Pt. 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   ONSET DATE: 02/28/22  REFERRING DIAG: right RC tear, left radial nerve palsy, left wrist contracture  THERAPY DIAG:  Pain in left wrist  Stiffness of left wrist, not elsewhere classified  Other symptoms and signs involving the nervous system  Other symptoms and signs involving the musculoskeletal system  Rationale for Evaluation and Treatment: Rehabilitation  SUBJECTIVE:   SUBJECTIVE STATEMENT: S: "It's just these last 2 fingers.."   PERTINENT HISTORY: Pt is a 62 y/o male presenting with right RC tear, left radial nerve palsy, and left wrist contracture. Pt presents in a standard prefabricated wrist splint today, minimal benefit to wrist and left hand. Pt fell several years ago landing on his right shoulder, then had surgery. In March, pt fell at work when tripping over a mat, then later tried to lift a box overhead and sustained a RC tear. Pt was scheduled for a reverse TSA, but has postponed due to the severity of deficits with his left hand and wrist. Pt has had a nerve conduction study completed, pt reports he was told that  it would be 6 months before he saw improvement. Currently, the pt's primary concern is his left hand and wrist, this is where he would like therapy to focus.   PRECAUTIONS: None  WEIGHT BEARING RESTRICTIONS: No  PAIN:  Are you having pain? Yes: NPRS scale: 1/10 Pain location: right shoulder and L wrist Pain description: aching, sore Aggravating factors: movement, use, lifting Relieving factors: nothing  FALLS: Has patient fallen in last 6 months? Yes. Number of falls 2  PATIENT GOALS: To get his left hand working again.   NEXT MD VISIT: unsure  OBJECTIVE:   HAND DOMINANCE: Left  ADLs: Overall ADLs: Pt reports he can wash the left side of his body and he can lift a lightweight milk jug with his left hand, otherwise he is unable to use his left hand functionally. He has been soaking his hand in epsom salts and hot water to stretch it.   FUNCTIONAL OUTCOME MEASURES: Quick Dash: 81.82 Quick Dash: (09/04/22): 59.09  UPPER EXTREMITY ROM:     Active ROM Left eval Left 08/31/22 Left 09/11/22 Left 10/13/22  Elbow flexion 130 140  140  Elbow extension 0 0  0  Wrist flexion 50 51 50 52  Wrist extension 0 16 17 24   Wrist ulnar deviation 0 24 38 38  Wrist radial deviation 0 6 15 16   Wrist pronation 45  WFL 90 WFL  Wrist supination 90 WFL 90 WFL  (Blank rows = not tested)  Active ROM Left eval Left 08/31/22 Left 09/11/22 Left  10/13/22  Thumb MCP (0-60) 72 65  70  Thumb IP (0-80) 21 50  55  Thumb Opposition to Small Finger  0 Able  Able  Index MCP (0-90)  72 65  60  Index PIP (0-100)  62 95  90  Index DIP (0-70)  30 60  55  Long MCP (0-90)  68 75  80  Long PIP (0-100)  62 85  90  Long DIP (0-70)  55 55  55  Ring MCP (0-90)  58 85  80  Ring PIP (0-100)  82 85  90  Ring DIP (0-70)  40 55  55  Little MCP (0-90)  74 85  85  Little PIP (0-100)  58 80  80  Little DIP (0-70)  45 55  55  (Blank rows = not tested)    -09/11/22: Pt is able to make a full fist now.   UPPER  EXTREMITY MMT:     MMT Left eval Left 08/31/22 Left 09/11/22 Left 10/13/22  Elbow flexion 5/5 5/5  5/5  Elbow extension 5/5 5/5  5/5  Wrist flexion 3-/5 4+/5 4+/5 5/5  Wrist extension 1/5 3/5 4-/5 4+/5  Wrist ulnar deviation 0/5 4-/5 4-/5 4+/5  Wrist radial deviation 0/5 3+/5 4-/5 4/5  Wrist pronation 2/5 3+/5 5/5 5/5  Wrist supination 3-/5 4/5 5/5 4+/5  (Blank rows = not tested)  HAND FUNCTION: Grip strength: Right: 68 lbs; Left: 10 lbs, Lateral pinch: Right: 13 lbs, Left: 6 lbs, and 3 point pinch: Right: 14 lbs, Left: 1 lbs Grip strength: (08/31/22)Left: 20 lbs, Lateral pinch: Left: 8 lbs, and 3 point pinch: Left: 9 lbs Grip strength: (09/11/22)Left: 24 lbs, Lateral pinch: Left: 9 lbs, and 3 point pinch: Left: 9 lbs Grip strength: (10/13/22)Left: 26 lbs, Lateral pinch: Left: 10 lbs, and 3 point pinch: Left: 10 lbs  COORDINATION: Unable to complete due to deficits 08/31/22: 9 Hole Peg Test: Right: 23.15 sec; Left: 41.42 sec 09/11/22: 9 Hole Peg Test: Left: 29.04" sec 10/13/22: 9 Hole Peg Test: Left: 29.04" sec  SENSATION: Light touch: WFL  EDEMA: None noted               08/31/22: mild swelling along the dorsal aspect of the hand  COGNITION: Overall cognitive status: Difficulty to assess due to: lack of sleep, emotional, tangential   TODAY'S TREATMENT:                                                                                                                              DATE:  11/17/22 -theraputty: green putty, roll into a ball, squeeze x10, pinch and pull to find 10 beads.  -Grooved Peg Board - 2\' 51"  - Gripper: 29# squeeze x10, 22lbs squeeze x10 and pick up 1 medium bead, 20lbs squeeze -  Pinch strengthening: red, green, and blue resistance clips, x6 each, tripod pinch up on vertical pole, lateral pinch to return to horizontal pole, attempted tip to tip pinch with red and green clips using D1 and D3 or D4 - max difficulty due to limited mobility and strength.   11/03/22 -ball  rolls: flexion/extension, supination/pronation, ulnar/radial deviation, blue weighted ball, x10 -Wrist ROM: flexion/extension, ulnar/radial deviation, supination/pronation, x15 -Wrist Strengthening: 3lb dumbbell, extension, flexion, ulnar/radial deviation, supination/pronation, x12 -Digiflex: 7lbs full squeeze x12, each finger squeeze x8 -Gripper: 22lb squeezes x10, 29lb squeezes x10 and picking up and stacking 6 cubes  10/27/22 -Theraputty: green putty, roll into a ball, squeeze x10, flatten into pancake, pull apart, use PVC pipe to cut cookies, roll out putty flat, roll into log, D3-D5 pinching, roll into ball -Pinch Strengthening: green resistance clip D1 to D2/D3 pinch stacking 6 cubes, red resistance clip D1-D3/D4 pinch stacking 6 cubes, red resistance clip D1 to D4/D5 pinch stacking 3 cubes -Screws: Large/Long screws x5, large/medium screws x5, Large/short x5, Small/medium x5 -Weighted ball wrist on fire - green weight ball   PATIENT EDUCATION: Education details: Reviewing HEP Person educated: Patient Education method: Explanation, Demonstration, and Handouts Education comprehension: verbalized understanding and returned demonstration  HOME EXERCISE PROGRAM: Eval: weightbearing 1/5: Radial Nerve Tendon Glide 1/19: Digit ROM 1/30: weighted wrist stretches 2/1: Theraputty Exercise 2/8: Wrist A/ROM 3/8: Coin Exercises 3/11: flicking cotton balls 3/27: Weight wrist extension stretch 3/29: weight ABC's in the air 4/19: Wrist strengthening with 3lb dumbbells   GOALS: Goals reviewed with patient? Yes  SHORT TERM GOALS: Target date: 08/08/22  Pt will be provided with and educated on HEP to improve mobility in LUE required for use during ADL completion.   Goal status: MET  2.  Pt will be provided with and educated on splint wear and care for radial nerve palsy, demonstrating independence in donning and doffing.   Goal status: MET  3.  Pt will increase left wrist strength to  3+/5 to improve ability to reach for items using his left hand and maintain a light grasp.   Goal status: MET  4.  Pt will increase left grip strength by 5# and pinch strength by 2# to improve ability to grasp and maintain hold on tools for ADL completion such as a cup, toothbrush, clothing, etc.   Goal status: MET    LONG TERM GOALS: Target date: 12/01/22  Pt will decrease pain in LUE to 3/10 or less to improve ability to sleep for 2+ consecutive hours without waking due to pain.   Goal status: IN PROGRESS  2.  Pt will decrease LUE fascial restrictions to min amounts or less to improve mobility required for functional reaching tasks.   Goal status: MET  3.  Pt will increase LUE A/ROM by at least 25 degrees to improve ability to use LUE when reaching overhead or behind back during dressing and bathing tasks.   Goal status: IN PROGRESS  4.  Pt will increase LUE strength to 5/5 or greater to improve ability to use LUE when lifting or carrying items during meal preparation/housework/yardwork tasks.   Goal status: REVISED  5.  Pt will return to highest level of function using LUE as dominant during functional task completion.   Goal status: IN PROGRESS      ASSESSMENT:  CLINICAL IMPRESSION: Pt continuing to work on wrist, grip and pinch strengthening. Overall his wrist appears stable with good mobility. He continues to be limited to 29lbs or less for  gripping functionally. When pinching, his thumb and index finger appear WFL, however middle finger and ring finger are limited with both mobility and strength. OT upgraded pt to green theraputty for improving grip and pinch strength, while manipulating items. Verbal and tactile cuing provided for positioning and technique with all exercises.    PERFORMANCE DEFICITS: in functional skills including ADLs, IADLs, coordination, dexterity, proprioception, sensation, edema, tone, ROM, strength, pain, fascial restrictions, Fine motor  control, endurance, and UE functional use, cognitive skills including attention, emotional, and thought, and psychosocial skills including coping strategies.    PLAN:  OT FREQUENCY: 1x/week  OT DURATION: 6 weeks  PLANNED INTERVENTIONS: self care/ADL training, therapeutic exercise, therapeutic activity, neuromuscular re-education, manual therapy, passive range of motion, splinting, electrical stimulation, paraffin, patient/family education, and DME and/or AE instructions  RECOMMENDED OTHER SERVICES: follow up with MD regarding sleep  CONSULTED AND AGREED WITH PLAN OF CARE: Patient  PLAN FOR NEXT SESSION: Follow up on HEP,  wrist strengthening using low weight, grip and pinch strengthening, coordination tasks   Trish Mage, OTR/L [phone] 11/17/2022, 8:41 AM

## 2022-11-24 ENCOUNTER — Encounter (HOSPITAL_COMMUNITY): Payer: Self-pay | Admitting: Occupational Therapy

## 2022-11-24 ENCOUNTER — Ambulatory Visit (HOSPITAL_COMMUNITY): Payer: BLUE CROSS/BLUE SHIELD | Admitting: Occupational Therapy

## 2022-11-24 DIAGNOSIS — M25532 Pain in left wrist: Secondary | ICD-10-CM

## 2022-11-24 DIAGNOSIS — R29818 Other symptoms and signs involving the nervous system: Secondary | ICD-10-CM

## 2022-11-24 DIAGNOSIS — M25632 Stiffness of left wrist, not elsewhere classified: Secondary | ICD-10-CM

## 2022-11-24 NOTE — Therapy (Unsigned)
OUTPATIENT OCCUPATIONAL THERAPY ORTHO TREATMENT NOTE   Patient Name: Dalton Guerrero MRN: [id number] DOB:[birth year], 62 y.o., male Today's Date: 11/24/2022  PCP: Dr. Gwen Pounds REFERRING PROVIDER: Bethann Berkshire, PA-C  OCCUPATIONAL THERAPY DISCHARGE SUMMARY  Visits from Start of Care: 24  Current functional level related to goals / functional outcomes: Dalton Guerrero has met 9/9 goals. He has a comprehensive HEP, his overall strength has improved to 5/5, his ROM is improving immensely in all digits and wrist. At this time he no longer needs his wrist splint as he has good control of his wrist now.    Remaining deficits: Dalton Guerrero reports he continues to feel weak and unable to control his movements well, despite significant improvements.     Education / Equipment: Dalton Guerrero provided a comprehensive HEP, theraband's and theraputty, to continue progressing his strength and mobility.   Plan: Patient agrees to discharge at this time, as he has plans for nerve decompression surgery soon and anticipates he will need to return to therapy after that.        END OF SESSION:  OT End of Session - 11/24/22 0819     Visit Number 24    Number of Visits 25    Date for OT Re-Evaluation 11/24/22    Authorization Type [employer]    Progress Note Due on Visit 20    OT Start Time 0820    OT Stop Time 0900    OT Time Calculation (min) 40 min    Activity Tolerance Patient tolerated treatment well    Behavior During Therapy WFL for tasks assessed/performed             Past Medical History:  Diagnosis Date   Hypertension    Past Surgical History:  Procedure Laterality Date   KIDNEY SURGERY Bilateral    Dalton Guerrero. 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   ONSET DATE: 02/28/22  REFERRING DIAG: right RC tear, left radial nerve palsy, left wrist contracture  THERAPY DIAG:  Pain in left wrist  Stiffness of left wrist, not elsewhere classified  Other symptoms and signs  involving the nervous system  Rationale for Evaluation and Treatment: Rehabilitation  SUBJECTIVE:   SUBJECTIVE STATEMENT: S: "I'm supposed to be having this nerve surgery soon"   PERTINENT HISTORY: Dalton Guerrero is a 61 y/o male presenting with right RC tear, left radial nerve palsy, and left wrist contracture. Dalton Guerrero presents in a standard prefabricated wrist splint today, minimal benefit to wrist and left hand. Dalton Guerrero fell several years ago landing on his right shoulder, then had surgery. In March, Dalton Guerrero fell at work when tripping over a mat, then later tried to lift a box overhead and sustained a RC tear. Dalton Guerrero was scheduled for a reverse TSA, but has postponed due to the severity of deficits with his left hand and wrist. Dalton Guerrero has had a nerve conduction study completed, Dalton Guerrero reports he was told that it would be 6 months before he saw improvement. Currently, the Dalton Guerrero's primary concern is his left hand and wrist, this is where he would like therapy to focus.   PRECAUTIONS: None  WEIGHT BEARING RESTRICTIONS: No  PAIN:  Are you having pain? No  FALLS: Has patient fallen in last 6 months? Yes. Number of falls 2  PATIENT GOALS: To get his left hand working again.   NEXT MD VISIT: unsure  OBJECTIVE:   HAND DOMINANCE: Left  ADLs: Overall ADLs: Dalton Guerrero reports he can wash the left  side of his body and he can lift a lightweight milk jug with his left hand, otherwise he is unable to use his left hand functionally. He has been soaking his hand in epsom salts and hot water to stretch it.   FUNCTIONAL OUTCOME MEASURES: Quick Dash: 81.82 Quick Dash: (09/04/22): 59.09 Quick Dash: 11/24/22: 38.64  UPPER EXTREMITY ROM:     Active ROM Left eval Left 08/31/22 Left 09/11/22 Left 10/13/22 Left 11/24/22  Elbow flexion 130 140  140 135  Elbow extension 0 0  0 0  Wrist flexion 50 51 50 52 75  Wrist extension 0 16 17 24 30   Wrist ulnar deviation 0 24 38 38 35  Wrist radial deviation 0 6 15 16 16   Wrist pronation 45 WFL 90 WFL WFL   Wrist supination 90 WFL 90 WFL WFL  (Blank rows = not tested)  Active ROM Left eval Left 08/31/22 Left 09/11/22 Left  10/13/22 Left 11/24/22  Thumb MCP (0-60) 72 65  70 70  Thumb IP (0-80) 21 50  55 55  Thumb Opposition to Small Finger  0 Able  Able Able  Index MCP (0-90)  72 65  60 68  Index PIP (0-100)  62 95  90 85  Index DIP (0-70)  30 60  55 60  Long MCP (0-90)  68 75  80 80  Long PIP (0-100)  62 85  90 85  Long DIP (0-70)  55 55  55 60  Ring MCP (0-90)  58 85  80 85  Ring PIP (0-100)  82 85  90 80  Ring DIP (0-70)  40 55  55 55  Little MCP (0-90)  74 85  85 85  Little PIP (0-100)  58 80  80 80  Little DIP (0-70)  45 55  55 55  (Blank rows = not tested)    -09/11/22: Dalton Guerrero is able to make a full fist now.   UPPER EXTREMITY MMT:     MMT Left eval Left 08/31/22 Left 09/11/22 Left 10/13/22 Left 11/24/22  Elbow flexion 5/5 5/5  5/5 5/5  Elbow extension 5/5 5/5  5/5 5/5  Wrist flexion 3-/5 4+/5 4+/5 5/5 5/5  Wrist extension 1/5 3/5 4-/5 4+/5 5/5  Wrist ulnar deviation 0/5 4-/5 4-/5 4+/5 4+/5  Wrist radial deviation 0/5 3+/5 4-/5 4/5 4+/5  Wrist pronation 2/5 3+/5 5/5 5/5 5/5  Wrist supination 3-/5 4/5 5/5 4+/5 5/5  (Blank rows = not tested)  HAND FUNCTION: Grip strength: Right: 68 lbs; Left: 10 lbs, Lateral pinch: Right: 13 lbs, Left: 6 lbs, and 3 point pinch: Right: 14 lbs, Left: 1 lbs Grip strength: (08/31/22)Left: 20 lbs, Lateral pinch: Left: 8 lbs, and 3 point pinch: Left: 9 lbs Grip strength: (09/11/22)Left: 24 lbs, Lateral pinch: Left: 9 lbs, and 3 point pinch: Left: 9 lbs Grip strength: (10/13/22)Left: 26 lbs, Lateral pinch: Left: 10 lbs, and 3 point pinch: Left: 10 lbs Grip strength: (10/25/22)Left: 35 lbs, Lateral pinch: Left: 12 lbs, and 3 point pinch: Left: 11 lbs  COORDINATION: Unable to complete due to deficits 08/31/22: 9 Hole Peg Test: Right: 23.15 sec; Left: 41.42 sec 09/11/22: 9 Hole Peg Test: Left: 29.04" sec 10/13/22: 9 Hole Peg Test: Left: 29.04"  sec 11/24/22: 9 Hole Peg Test: Left: 27.34 sec  SENSATION: Light touch: WFL  EDEMA: None noted               08/31/22: mild swelling along the dorsal aspect of the hand  COGNITION: Overall cognitive status: Difficulty to assess due to: lack of sleep, emotional, tangential   TODAY'S TREATMENT:                                                                                                                              DATE:  11/24/22 -Wrist ROM: flexion/extension, ulnar/radial deviation, supination/pronation, x15 -Wrist Strengthening: 4lb dumbbell, extension, flexion, ulnar/radial deviation, supination/pronation, x12 --Digiflex: 7lbs full squeeze x12, each finger squeeze x8 -Measurements for Reassessment  11/17/22 -theraputty: green putty, roll into a ball, squeeze x10, pinch and pull to find 10 beads.  -Grooved Peg Board - 2\' 51"  - Gripper: 29# squeeze x10, 22lbs squeeze x10 and pick up 1 medium bead, 20lbs squeeze - Pinch strengthening: red, green, and blue resistance clips, x6 each, tripod pinch up on vertical pole, lateral pinch to return to horizontal pole, attempted tip to tip pinch with red and green clips using D1 and D3 or D4 - max difficulty due to limited mobility and strength.   11/03/22 -ball rolls: flexion/extension, supination/pronation, ulnar/radial deviation, blue weighted ball, x10 -Wrist ROM: flexion/extension, ulnar/radial deviation, supination/pronation, x15 -Wrist Strengthening: 3lb dumbbell, extension, flexion, ulnar/radial deviation, supination/pronation, x12 -Digiflex: 7lbs full squeeze x12, each finger squeeze x8 -Gripper: 22lb squeezes x10, 29lb squeezes x10 and picking up and stacking 6 cubes   PATIENT EDUCATION: Education details: Reviewing HEP Person educated: Patient Education method: Explanation, Demonstration, and Handouts Education comprehension: verbalized understanding and returned demonstration  HOME EXERCISE PROGRAM: Eval: weightbearing 1/5:  Radial Nerve Tendon Glide 1/19: Digit ROM 1/30: weighted wrist stretches 2/1: Theraputty Exercise 2/8: Wrist A/ROM 3/8: Coin Exercises 3/11: flicking cotton balls 3/27: Weight wrist extension stretch 3/29: weight ABC's in the air 4/19: Wrist strengthening with 3lb dumbbells   GOALS: Goals reviewed with patient? Yes  SHORT TERM GOALS: Target date: 08/08/22  Dalton Guerrero will be provided with and educated on HEP to improve mobility in LUE required for use during ADL completion.   Goal status: MET  2.  Dalton Guerrero will be provided with and educated on splint wear and care for radial nerve palsy, demonstrating independence in donning and doffing.   Goal status: MET  3.  Dalton Guerrero will increase left wrist strength to 3+/5 to improve ability to reach for items using his left hand and maintain a light grasp.   Goal status: MET  4.  Dalton Guerrero will increase left grip strength by 5# and pinch strength by 2# to improve ability to grasp and maintain hold on tools for ADL completion such as a cup, toothbrush, clothing, etc.   Goal status: MET    LONG TERM GOALS: Target date: 12/01/22  Dalton Guerrero will decrease pain in LUE to 3/10 or less to improve ability to sleep for 2+ consecutive hours without waking due to pain.   Goal status: MET  2.  Dalton Guerrero will decrease LUE fascial restrictions to min amounts or less to improve mobility required for functional reaching tasks.   Goal status:  MET  3.  Dalton Guerrero will increase LUE A/ROM by at least 25 degrees to improve ability to use LUE when reaching overhead or behind back during dressing and bathing tasks.   Goal status: MET  4.  Dalton Guerrero will increase LUE strength to 5/5 or greater to improve ability to use LUE when lifting or carrying items during meal preparation/housework/yardwork tasks.   Goal status: PARTIALLY MET  5.  Dalton Guerrero will return to highest level of function using LUE as dominant during functional task completion.   Goal status: MET      ASSESSMENT:  CLINICAL IMPRESSION: Dalton Guerrero  was reassessed this session and has met all of his therapy goals at this time. He is planning to have a nerve decompression surgery in the coming weeks and since all goals have been met agrees to discharge and continue working on progressing strength and mobility at home until his surgery. OT and Dalton Guerrero reviewed all HEP and Dalton Guerrero demonstrating good body mechanics and technique throughout. He has no further skilled OT needs at this time and will be discharged.    PERFORMANCE DEFICITS: in functional skills including ADLs, IADLs, coordination, dexterity, proprioception, sensation, edema, tone, ROM, strength, pain, fascial restrictions, Fine motor control, endurance, and UE functional use, cognitive skills including attention, emotional, and thought, and psychosocial skills including coping strategies.    PLAN:  OT FREQUENCY: 1x/week  OT DURATION: 6 weeks  PLANNED INTERVENTIONS: self care/ADL training, therapeutic exercise, therapeutic activity, neuromuscular re-education, manual therapy, passive range of motion, splinting, electrical stimulation, paraffin, patient/family education, and DME and/or AE instructions  RECOMMENDED OTHER SERVICES: follow up with MD regarding sleep  CONSULTED AND AGREED WITH PLAN OF CARE: Patient  PLAN FOR NEXT SESSION: Discharge  Trish Mage, OTR/L [phone] 11/24/2022, 8:20 AM

## 2023-01-05 ENCOUNTER — Encounter (HOSPITAL_COMMUNITY): Payer: Self-pay | Admitting: Occupational Therapy

## 2023-01-05 ENCOUNTER — Ambulatory Visit (HOSPITAL_COMMUNITY): Payer: BLUE CROSS/BLUE SHIELD | Attending: Family Medicine | Admitting: Occupational Therapy

## 2023-01-05 DIAGNOSIS — M25522 Pain in left elbow: Secondary | ICD-10-CM | POA: Insufficient documentation

## 2023-01-05 DIAGNOSIS — M25632 Stiffness of left wrist, not elsewhere classified: Secondary | ICD-10-CM | POA: Insufficient documentation

## 2023-01-05 DIAGNOSIS — R29898 Other symptoms and signs involving the musculoskeletal system: Secondary | ICD-10-CM | POA: Insufficient documentation

## 2023-01-05 DIAGNOSIS — M25532 Pain in left wrist: Secondary | ICD-10-CM | POA: Diagnosis present

## 2023-01-05 NOTE — Therapy (Signed)
OUTPATIENT OCCUPATIONAL THERAPY ORTHO EVALUATION  Patient Name: Dalton Guerrero MRN: [id number] DOB:1961-07-16, 62 y.o., male Today's Date: 01/05/2023  PCP: Gwen Pounds, MD REFERRING PROVIDER: Joycelyn Das, PA  END OF SESSION:  OT End of Session - 01/05/23 2213     Visit Number 1    Number of Visits 7    Date for OT Re-Evaluation 02/23/23    Authorization Type [employer]    OT Start Time 0820    OT Stop Time 0902    OT Time Calculation (min) 42 min    Activity Tolerance Patient tolerated treatment well    Behavior During Therapy WFL for tasks assessed/performed             Past Medical History:  Diagnosis Date   Hypertension    Past Surgical History:  Procedure Laterality Date   KIDNEY SURGERY Bilateral    Pt. 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   PRECAUTIONS: None  WEIGHT BEARING RESTRICTIONS: Yes <5lbs  PAIN:  Are you having pain? Yes: NPRS scale: 3/10 Pain location: elbow and hand Pain description: tender Aggravating factors: pressure Relieving factors: resting  FALLS: Has patient fallen in last 6 months? No  LIVING ENVIRONMENT: Lives with: lives alone Lives in: House/apartment  PLOF: Independent  PATIENT GOALS: To get my hand moving better  NEXT MD VISIT: 02/2023  OBJECTIVE:   HAND DOMINANCE:  Left  ADLs: Overall ADLs: Difficulty with self-care activities, difficulty with household chores, difficulty opening containers, difficulty with resisted grip and pinch, difficulty with carrying task objects   FUNCTIONAL OUTCOME MEASURES: FOTO: Will complete next session  UPPER EXTREMITY ROM:     Active ROM Left eval  Elbow flexion 124  Elbow extension -16  Wrist flexion 72  Wrist extension 36  Wrist ulnar deviation 34  Wrist radial deviation 10  Wrist pronation WFL  Wrist supination WFL  (Blank rows = not tested)    Fist: 80% of a full fist, index finger unable to fully flex  UPPER EXTREMITY MMT:     MMT Left eval  Elbow flexion 4+/5  Elbow extension 4+/5  Wrist flexion 4+/5  Wrist extension 4/5  Wrist ulnar deviation 4+/5  Wrist radial deviation 4/5  Wrist pronation 4/5  Wrist supination 4+/5  (Blank rows = not tested)  HAND FUNCTION: Grip strength: Right: 70 lbs; Left: 34 lbs, Lateral pinch: Right: 19 lbs, Left: 12 lbs, and 3 point pinch: Right: 16 lbs, Left: 12 lbs  COORDINATION: 9 Hole Peg test: Right: 22.21 sec; Left: 31.76 sec  SENSATION: Light touch: Impaired  and from elbow down to fingers sensation is dull compared to RUE  EDEMA: Swelling noted around elbow.  OBSERVATIONS: moderate fascial restrictions noted around elbow and thumb.    TODAY'S TREATMENT:  DATE: 01/05/23: Evaluation Only    PATIENT EDUCATION: Education details: Wrist A/ROM Person educated: Patient Education method: Explanation, Demonstration, and Handouts Education comprehension: verbalized understanding, returned demonstration, and needs further education  HOME EXERCISE PROGRAM: 6/21: Wrist A/ROM  GOALS: Goals reviewed with patient? Yes  SHORT TERM GOALS: Target date: 02/23/23  Pt will be provided with and educated on HEP to improve mobility in LUE  required for use during ADL completion.   Goal status: INITIAL  2.  Pt will decrease pain in LUE to 3/10 or less to improve ability to sleep for 2+ consecutive hours without waking due to pain.    Goal status: INITIAL  3.  Pt will decrease LUE fascial restrictions to min amounts or less to improve mobility required for functional reaching tasks   Goal status: INITIAL  4.  Pt will increase LUE ROM by 10 degrees in all motions to improve ability to manipulate items during dressing, bathing, and grooming.   Goal status: INITIAL  5.  Pt will increase LUE strength to 5/5 or greater to improve ability to use LUE when lifting or carrying items during meal preparation/housework/yardwork tasks   Goal status: INITIAL  6.  Pt will increase left grip strength by 5# and pinch strength by 2# to improve ability to grasp and maintain hold on tools for ADL completion such as a cup, toothbrush, clothing, etc.   Goal status: INITIAL   ASSESSMENT:  CLINICAL IMPRESSION: Patient is a 62 y.o. male who was seen today for occupational therapy evaluation for L cubital tunnel release following radial nerve palsy and left wrist contracture. Pt presenting with limited ROM, strength, and sensation, limiting him from completing lifting and carrying tasks, as well as fine motor tasks.   PERFORMANCE DEFICITS: in functional skills including ADLs, IADLs, coordination, dexterity, sensation, edema, ROM, strength, pain, fascial restrictions, Fine motor control, Gross motor control, body mechanics, and UE functional use.  IMPAIRMENTS: are limiting patient from ADLs, IADLs, rest and sleep, work, leisure, and social participation.   COMORBIDITIES: may have co-morbidities  that affects occupational performance. Patient will benefit from skilled OT to address above impairments and improve overall function.  MODIFICATION OR ASSISTANCE TO COMPLETE EVALUATION: No modification of tasks or assist necessary to complete an  evaluation.  OT OCCUPATIONAL PROFILE AND HISTORY: Problem focused assessment: Including review of records relating to presenting problem.  CLINICAL DECISION MAKING: LOW - limited treatment options, no task modification necessary  REHAB POTENTIAL: Good  EVALUATION COMPLEXITY: Low      PLAN:  OT FREQUENCY: 1-2x/week  OT DURATION: 6 weeks  PLANNED INTERVENTIONS: self care/ADL training, therapeutic exercise, therapeutic activity, manual therapy, scar mobilization, passive range of motion, functional mobility training, electrical stimulation, ultrasound, paraffin, moist heat, patient/family education, energy conservation, and coping strategies training  RECOMMENDED OTHER SERVICES: N/A  CONSULTED AND AGREED WITH PLAN OF CARE: Patient  PLAN FOR NEXT SESSION: Manual Therapy, P/ROM, stretching, Nerve Glides, A/ROM, grip and pinch strengthening   Trish Mage, OTR/L [facility] [phone]  Rosemarie Beath, OT 01/05/2023, 10:18 PM

## 2023-01-05 NOTE — Patient Instructions (Signed)
AROM Exercises   1) Wrist Flexion  Start with wrist at edge of table, palm facing up. With wrist hanging slightly off table, curl wrist upward, and back down.      2) Wrist Extension  Start with wrist at edge of table, palm facing down. With wrist slightly off the edge of the table, curl wrist up and back down.      3) Radial Deviations  Start with forearm flat against a table, wrist hanging slightly off the edge, and palm facing the wall. Bending at the wrist only, and keeping palm facing the wall, bend wrist so fist is pointing towards the floor, back up to start position, and up towards the ceiling. Return to start.        4) WRIST PRONATION  Turn your forearm towards palm face down.  Keep your elbow bent and by the side of your  Body.      5) WRIST SUPINATION  Turn your forearm towards palm face up.  Keep your elbow bent and by the side of your  Body.      *Complete exercises ______ times each, _______ times per day*   

## 2023-01-12 ENCOUNTER — Ambulatory Visit (HOSPITAL_COMMUNITY): Payer: BLUE CROSS/BLUE SHIELD | Admitting: Occupational Therapy

## 2023-01-12 ENCOUNTER — Encounter (HOSPITAL_COMMUNITY): Payer: Self-pay | Admitting: Occupational Therapy

## 2023-01-12 DIAGNOSIS — R29898 Other symptoms and signs involving the musculoskeletal system: Secondary | ICD-10-CM

## 2023-01-12 DIAGNOSIS — M25532 Pain in left wrist: Secondary | ICD-10-CM

## 2023-01-12 DIAGNOSIS — M25522 Pain in left elbow: Secondary | ICD-10-CM

## 2023-01-12 DIAGNOSIS — M25632 Stiffness of left wrist, not elsewhere classified: Secondary | ICD-10-CM

## 2023-01-12 NOTE — Patient Instructions (Signed)
Strengthening Exercises  1) WRIST EXTENSION CURLS - TABLE  Hold a small free weight, rest your forearm on a table and bend your wrist up and down with your palm face down as shown.      2) WRIST FLEXION CURLS - TABLE  Hold a small free weight, rest your forearm on a table and bend your wrist up and down with your palm face up as shown.     3) FREE WEIGHT RADIAL/ULNAR DEVIATION - TABLE  Hold a small free weight, rest your forearm on a table and bend your wrist up and down with your palm facing towards the side as shown.     4) Pronation  Forearm supported on table with wrist in neutral position. Using a weight, roll wrist so that palm faces downward. Hold for 2 seconds and return to starting position.     5) Supination  Forearm supported on table with wrist in neutral position. Using a weight, roll wrist so that palm is now facing upward. Hold for 2 seconds and return to starting position.      *Complete exercises using ____ pound weight, ____times each, ____times per day*  

## 2023-01-12 NOTE — Therapy (Signed)
OUTPATIENT OCCUPATIONAL THERAPY ORTHO TREATMENT NOTE  Patient Name: Dalton Guerrero MRN: [id number] DOB:1960-08-08, 62 y.o., male Today's Date: 01/12/2023  PCP: Gwen Pounds, MD REFERRING PROVIDER: Joycelyn Das, PA  END OF SESSION:  OT End of Session - 01/12/23 1944     Visit Number 2    Number of Visits 7    Date for OT Re-Evaluation 02/23/23    Authorization Type BCBS    OT Start Time 0823    OT Stop Time 0906    OT Time Calculation (min) 43 min    Activity Tolerance Patient tolerated treatment well    Behavior During Therapy [facility] for tasks assessed/performed             Past Medical History:  Diagnosis Date   Hypertension    Past Surgical History:  Procedure Laterality Date   KIDNEY SURGERY Bilateral    Pt. 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   PRECAUTIONS: None  WEIGHT BEARING RESTRICTIONS: Yes <5lbs  PAIN:  Are you having pain? Yes: NPRS scale: 3/10 Pain location: elbow and hand Pain description: tender Aggravating factors: pressure Relieving factors: resting  FALLS: Has patient fallen in last 6 months? No  LIVING ENVIRONMENT: Lives with: lives alone Lives in: House/apartment  PLOF: Independent  PATIENT GOALS: To get my hand moving better  NEXT MD VISIT: 02/2023  OBJECTIVE:   HAND  DOMINANCE: Left  ADLs: Overall ADLs: Difficulty with self-care activities, difficulty with household chores, difficulty opening containers, difficulty with resisted grip and pinch, difficulty with carrying task objects   FUNCTIONAL OUTCOME MEASURES: FOTO: Will complete next session  UPPER EXTREMITY ROM:     Active ROM Left eval  Elbow flexion 124  Elbow extension -16  Wrist flexion 72  Wrist extension 36  Wrist ulnar deviation 34  Wrist radial deviation 10  Wrist pronation WFL  Wrist supination WFL  (Blank rows = not tested)    Fist: 80% of a full fist, index finger unable to fully flex  UPPER EXTREMITY MMT:     MMT Left eval  Elbow flexion 4+/5  Elbow extension 4+/5  Wrist flexion 4+/5  Wrist extension 4/5  Wrist ulnar deviation 4+/5  Wrist radial deviation 4/5  Wrist pronation 4/5  Wrist supination 4+/5  (Blank rows = not tested)  HAND FUNCTION: Grip strength: Right: 70 lbs; Left: 34 lbs, Lateral pinch: Right: 19 lbs, Left: 12 lbs, and 3 point pinch: Right: 16 lbs, Left: 12 lbs  COORDINATION: 9 Hole Peg test: Right: 22.21 sec; Left: 31.76 sec  SENSATION: Light touch: Impaired  and from elbow down to fingers sensation is dull compared to RUE  EDEMA: Swelling noted around elbow.  OBSERVATIONS: moderate fascial restrictions noted around elbow and thumb.    TODAY'S TREATMENT:  DATE:   01/12/23 -Digit ROM: composite flexion, finger taps, abduction, opposition, x10 -Wrist ROM: flexion, extension, ulnar/radial deviation, supination/pronation, x15 -Wrist strengthening: 2lbs, flexion, extension, ulnar/radial deviation, supination/pronation, x15 -Paraffin and moist heat, 8'   PATIENT EDUCATION: Education details: Wrist strengthening Person educated: Patient Education method: Explanation, Demonstration, and Handouts Education  comprehension: verbalized understanding, returned demonstration, and needs further education  HOME EXERCISE PROGRAM: 6/21: Wrist A/ROM 6/28: Wrist Strengthening  GOALS: Goals reviewed with patient? Yes  SHORT TERM GOALS: Target date: 02/23/23  Pt will be provided with and educated on HEP to improve mobility in LUE required for use during ADL completion.   Goal status: IN PROGRESS  2.  Pt will decrease pain in LUE to 3/10 or less to improve ability to sleep for 2+ consecutive hours without waking due to pain.    Goal status: IN PROGRESS  3.  Pt will decrease LUE fascial restrictions to min amounts or less to improve mobility required for functional reaching tasks   Goal status: IN PROGRESS  4.  Pt will increase LUE ROM by 10 degrees in all motions to improve ability to manipulate items during dressing, bathing, and grooming.   Goal status: IN PROGRESS  5.  Pt will increase LUE strength to 5/5 or greater to improve ability to use LUE when lifting or carrying items during meal preparation/housework/yardwork tasks   Goal status: IN PROGRESS  6.  Pt will increase left grip strength by 5# and pinch strength by 2# to improve ability to grasp and maintain hold on tools for ADL completion such as a cup, toothbrush, clothing, etc.   Goal status: IN PROGRESS   ASSESSMENT:  CLINICAL IMPRESSION: This session pt reviewed wrist ROM and digit ROM, then was able to work on wrist strengthening with 2lb dumbbells. He reported moderate pain with wrist strengthening repetitions. Additionally this session he reported increased stiffness and pain in his fingers, so OT had pt complete paraffin bath for pain relief and improved ROM. OT providing verbal and tactile cuing for positioning and technique throughout session.   PERFORMANCE DEFICITS: in functional skills including ADLs, IADLs, coordination, dexterity, sensation, edema, ROM, strength, pain, fascial restrictions, Fine motor control, Gross motor  control, body mechanics, and UE functional use.    PLAN:  OT FREQUENCY: 1-2x/week  OT DURATION: 6 weeks  PLANNED INTERVENTIONS: self care/ADL training, therapeutic exercise, therapeutic activity, manual therapy, scar mobilization, passive range of motion, functional mobility training, electrical stimulation, ultrasound, paraffin, moist heat, patient/family education, energy conservation, and coping strategies training  RECOMMENDED OTHER SERVICES: N/A  CONSULTED AND AGREED WITH PLAN OF CARE: Patient  PLAN FOR NEXT SESSION: Manual Therapy, P/ROM, stretching, Nerve Glides, A/ROM, grip and pinch strengthening   Trish Mage, OTR/L [facility] [phone]  Rosemarie Beath, OT 01/12/2023, 7:46 PM

## 2023-01-16 ENCOUNTER — Ambulatory Visit (HOSPITAL_COMMUNITY): Payer: BLUE CROSS/BLUE SHIELD | Attending: Family Medicine | Admitting: Occupational Therapy

## 2023-01-16 ENCOUNTER — Encounter (HOSPITAL_COMMUNITY): Payer: Self-pay | Admitting: Occupational Therapy

## 2023-01-16 DIAGNOSIS — M25522 Pain in left elbow: Secondary | ICD-10-CM | POA: Insufficient documentation

## 2023-01-16 DIAGNOSIS — M25532 Pain in left wrist: Secondary | ICD-10-CM | POA: Insufficient documentation

## 2023-01-16 DIAGNOSIS — M25632 Stiffness of left wrist, not elsewhere classified: Secondary | ICD-10-CM | POA: Insufficient documentation

## 2023-01-16 DIAGNOSIS — R29898 Other symptoms and signs involving the musculoskeletal system: Secondary | ICD-10-CM | POA: Insufficient documentation

## 2023-01-16 NOTE — Therapy (Signed)
OUTPATIENT OCCUPATIONAL THERAPY ORTHO TREATMENT NOTE  Patient Name: Dalton Guerrero MRN: [id number] DOB:1961-04-30, 62 y.o., male Today's Date: 01/16/2023  PCP: Gwen Pounds, MD REFERRING PROVIDER: Joycelyn Das, PA  END OF SESSION:  OT End of Session - 01/16/23 1018     Visit Number 3    Number of Visits 7    Date for OT Re-Evaluation 02/23/23    Authorization Type BCBS    OT Start Time 0907    OT Stop Time 0947    OT Time Calculation (min) 40 min    Activity Tolerance Patient tolerated treatment well    Behavior During Therapy WFL for tasks assessed/performed              Past Medical History:  Diagnosis Date   Hypertension    Past Surgical History:  Procedure Laterality Date   KIDNEY SURGERY Bilateral    Pt. 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   PRECAUTIONS: None  WEIGHT BEARING RESTRICTIONS: Yes <5lbs  PAIN:  Are you having pain? Yes: NPRS scale: 3/10 Pain location: elbow and hand Pain description: tender Aggravating factors: pressure Relieving factors: resting  FALLS: Has patient fallen in last 6 months? No  LIVING ENVIRONMENT: Lives with: lives alone Lives in: House/apartment  PLOF: Independent  PATIENT GOALS: To get my hand moving better  NEXT MD VISIT: 02/2023  OBJECTIVE:   HAND DOMINANCE:  Left  ADLs: Overall ADLs: Difficulty with self-care activities, difficulty with household chores, difficulty opening containers, difficulty with resisted grip and pinch, difficulty with carrying task objects   FUNCTIONAL OUTCOME MEASURES: FOTO: Will complete next session  UPPER EXTREMITY ROM:     Active ROM Left eval  Elbow flexion 124  Elbow extension -16  Wrist flexion 72  Wrist extension 36  Wrist ulnar deviation 34  Wrist radial deviation 10  Wrist pronation WFL  Wrist supination WFL  (Blank rows = not tested)    Fist: 80% of a full fist, index finger unable to fully flex  UPPER EXTREMITY MMT:     MMT Left eval  Elbow flexion 4+/5  Elbow extension 4+/5  Wrist flexion 4+/5  Wrist extension 4/5  Wrist ulnar deviation 4+/5  Wrist radial deviation 4/5  Wrist pronation 4/5  Wrist supination 4+/5  (Blank rows = not tested)  HAND FUNCTION: Grip strength: Right: 70 lbs; Left: 34 lbs, Lateral pinch: Right: 19 lbs, Left: 12 lbs, and 3 point pinch: Right: 16 lbs, Left: 12 lbs  COORDINATION: 9 Hole Peg test: Right: 22.21 sec; Left: 31.76 sec  SENSATION: Light touch: Impaired  and from elbow down to fingers sensation is dull compared to RUE  EDEMA: Swelling noted around elbow.  OBSERVATIONS: moderate fascial restrictions noted around elbow and thumb.    TODAY'S TREATMENT:  DATE:   01/16/23 -P/ROM: flexion, extension, ulnar/radial deviation, supination/pronation, x8 -Digit ROM: composite flexion, finger taps, abduction, opposition, x10 -Gripping: 25 lbs, vertical picking up 10 medium beads, tried 29lbs -Wrist Strengthening: 3lbs, flexion, extension, ulnar/radial deviation, supination/pronation, x15 -Wrist ABC's: red weighted ball x2  01/12/23 -Digit ROM: composite flexion, finger taps, abduction, opposition, x10 -Wrist ROM: flexion, extension,  ulnar/radial deviation, supination/pronation, x15 -Wrist strengthening: 2lbs, flexion, extension, ulnar/radial deviation, supination/pronation, x15 -Paraffin and moist heat, 8'   PATIENT EDUCATION: Education details: Wrist ABC's Person educated: Patient Education method: Explanation, Demonstration, and Handouts Education comprehension: verbalized understanding, returned demonstration, and needs further education  HOME EXERCISE PROGRAM: 6/21: Wrist A/ROM 6/28: Wrist Strengthening 7/2: Wrist ABC's  GOALS: Goals reviewed with patient? Yes  SHORT TERM GOALS: Target date: 02/23/23  Pt will be provided with and educated on HEP to improve mobility in LUE required for use during ADL completion.   Goal status: IN PROGRESS  2.  Pt will decrease pain in LUE to 3/10 or less to improve ability to sleep for 2+ consecutive hours without waking due to pain.    Goal status: IN PROGRESS  3.  Pt will decrease LUE fascial restrictions to min amounts or less to improve mobility required for functional reaching tasks   Goal status: IN PROGRESS  4.  Pt will increase LUE ROM by 10 degrees in all motions to improve ability to manipulate items during dressing, bathing, and grooming.   Goal status: IN PROGRESS  5.  Pt will increase LUE strength to 5/5 or greater to improve ability to use LUE when lifting or carrying items during meal preparation/housework/yardwork tasks   Goal status: IN PROGRESS  6.  Pt will increase left grip strength by 5# and pinch strength by 2# to improve ability to grasp and maintain hold on tools for ADL completion such as a cup, toothbrush, clothing, etc.   Goal status: IN PROGRESS   ASSESSMENT:  CLINICAL IMPRESSION: Pt worked on overall strengthening this session. He was able to complete wrist strengthening with 3lb dumbbells, however when switching to grip strengthening, he was unable to grip and pick up medium beads, despite being able to fully grip the gripper, most  likely due to radial deviation weakness. OT had pt work on spelling the [employer] holding a weighted ball to focus on strengthening/endurance in the wrist. Verbal and visual cuing provided throughout session for positioning and technique.   PERFORMANCE DEFICITS: in functional skills including ADLs, IADLs, coordination, dexterity, sensation, edema, ROM, strength, pain, fascial restrictions, Fine motor control, Gross motor control, body mechanics, and UE functional use.    PLAN:  OT FREQUENCY: 1-2x/week  OT DURATION: 6 weeks  PLANNED INTERVENTIONS: self care/ADL training, therapeutic exercise, therapeutic activity, manual therapy, scar mobilization, passive range of motion, functional mobility training, electrical stimulation, ultrasound, paraffin, moist heat, patient/family education, energy conservation, and coping strategies training  RECOMMENDED OTHER SERVICES: N/A  CONSULTED AND AGREED WITH PLAN OF CARE: Patient  PLAN FOR NEXT SESSION: Manual Therapy, P/ROM, stretching, Nerve Glides, A/ROM, grip and pinch strengthening   Trish Mage, OTR/L [facility] [phone]  Rosemarie Beath, OT 01/16/2023, 10:19 AM

## 2023-01-22 ENCOUNTER — Encounter (HOSPITAL_COMMUNITY): Payer: BLUE CROSS/BLUE SHIELD | Admitting: Occupational Therapy

## 2023-01-30 ENCOUNTER — Ambulatory Visit (HOSPITAL_COMMUNITY): Payer: BLUE CROSS/BLUE SHIELD | Admitting: Occupational Therapy

## 2023-01-30 ENCOUNTER — Encounter (HOSPITAL_COMMUNITY): Payer: Self-pay | Admitting: Occupational Therapy

## 2023-01-30 DIAGNOSIS — M25532 Pain in left wrist: Secondary | ICD-10-CM

## 2023-01-30 DIAGNOSIS — R29898 Other symptoms and signs involving the musculoskeletal system: Secondary | ICD-10-CM

## 2023-01-30 DIAGNOSIS — M25632 Stiffness of left wrist, not elsewhere classified: Secondary | ICD-10-CM

## 2023-01-30 DIAGNOSIS — M25522 Pain in left elbow: Secondary | ICD-10-CM

## 2023-01-30 NOTE — Therapy (Signed)
OUTPATIENT OCCUPATIONAL THERAPY ORTHO TREATMENT NOTE  Patient Name: Dalton Guerrero MRN: [id number] DOB:1961/01/13, 62 y.o., male Today's Date: 01/30/2023  PCP: Gwen Pounds, MD REFERRING PROVIDER: Joycelyn Das, PA  END OF SESSION:  OT End of Session - 01/30/23 1842     Visit Number 4    Number of Visits 7    Date for OT Re-Evaluation 02/23/23    Authorization Type BCBS    OT Start Time 0903    OT Stop Time 0948    OT Time Calculation (min) 45 min    Activity Tolerance Patient tolerated treatment well    Behavior During Therapy WFL for tasks assessed/performed             Past Medical History:  Diagnosis Date   Hypertension    Past Surgical History:  Procedure Laterality Date   KIDNEY SURGERY Bilateral    Pt. 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   PRECAUTIONS: None  WEIGHT BEARING RESTRICTIONS: Yes <5lbs  PAIN:  Are you having pain? Yes: NPRS scale: 1/10 Pain location: elbow and hand Pain description: tender Aggravating factors: pressure Relieving factors: resting  FALLS: Has patient fallen in last 6 months? No  LIVING ENVIRONMENT: Lives with: lives alone Lives in: House/apartment  PLOF: Independent  PATIENT GOALS: To get my hand moving better  NEXT MD VISIT:  02/2023  OBJECTIVE:   HAND DOMINANCE: Left  ADLs: Overall ADLs: Difficulty with self-care activities, difficulty with household chores, difficulty opening containers, difficulty with resisted grip and pinch, difficulty with carrying task objects   FUNCTIONAL OUTCOME MEASURES: FOTO: Will complete next session  UPPER EXTREMITY ROM:     Active ROM Left eval  Elbow flexion 124  Elbow extension -16  Wrist flexion 72  Wrist extension 36  Wrist ulnar deviation 34  Wrist radial deviation 10  Wrist pronation WFL  Wrist supination WFL  (Blank rows = not tested)    Fist: 80% of a full fist, index finger unable to fully flex  UPPER EXTREMITY MMT:     MMT Left eval  Elbow flexion 4+/5  Elbow extension 4+/5  Wrist flexion 4+/5  Wrist extension 4/5  Wrist ulnar deviation 4+/5  Wrist radial deviation 4/5  Wrist pronation 4/5  Wrist supination 4+/5  (Blank rows = not tested)  HAND FUNCTION: Grip strength: Right: 70 lbs; Left: 34 lbs, Lateral pinch: Right: 19 lbs, Left: 12 lbs, and 3 point pinch: Right: 16 lbs, Left: 12 lbs  COORDINATION: 9 Hole Peg test: Right: 22.21 sec; Left: 31.76 sec  SENSATION: Light touch: Impaired  and from elbow down to fingers sensation is dull compared to RUE  EDEMA: Swelling noted around elbow.  OBSERVATIONS: moderate fascial restrictions noted around elbow and thumb.    TODAY'S TREATMENT:  DATE:   01/30/23 -Digit ROM: composite flexion, finger taps, abduction, opposition, x10 -Wrist Strengthening: 3lbs, flexion, extension, ulnar/radial deviation, supination/pronation, x15 -Pinch Strengthening: green, blue, and black resistance -Digit Stretches: extension, flexion at MCP, PIP, and DIP, thumb abduction, 6x8-10" -Rubber Band Exercises: red band, digit abduction, digit flexion, digit extension, thumb abduction, x10  each  01/16/23 -P/ROM: flexion, extension, ulnar/radial deviation, supination/pronation, x8 -Digit ROM: composite flexion, finger taps, abduction, opposition, x10 -Gripping: 25 lbs, vertical picking up 10 medium beads, tried 29lbs -Wrist Strengthening: 3lbs, flexion, extension, ulnar/radial deviation, supination/pronation, x15 -Wrist ABC's: red weighted ball x2  01/12/23 -Digit ROM: composite flexion, finger taps, abduction, opposition, x10 -Wrist ROM: flexion, extension, ulnar/radial deviation, supination/pronation, x15 -Wrist strengthening: 2lbs, flexion, extension, ulnar/radial deviation, supination/pronation, x15 -Paraffin and moist heat, 8'   PATIENT EDUCATION: Education details: Warden/ranger Person educated: Patient Education method: Explanation, Demonstration, and Handouts Education comprehension: verbalized understanding, returned demonstration, and needs further education  HOME EXERCISE PROGRAM: 6/21: Wrist A/ROM 6/28: Wrist Strengthening 7/2: Wrist ABC's 7/16: Rubber Band Exercises  GOALS: Goals reviewed with patient? Yes  SHORT TERM GOALS: Target date: 02/23/23  Pt will be provided with and educated on HEP to improve mobility in LUE required for use during ADL completion.   Goal status: IN PROGRESS  2.  Pt will decrease pain in LUE to 3/10 or less to improve ability to sleep for 2+ consecutive hours without waking due to pain.    Goal status: IN PROGRESS  3.  Pt will decrease LUE fascial restrictions to min amounts or less to improve mobility required for functional reaching tasks   Goal status: IN PROGRESS  4.  Pt will increase LUE ROM by 10 degrees in all motions to improve ability to manipulate items during dressing, bathing, and grooming.   Goal status: IN PROGRESS  5.  Pt will increase LUE strength to 5/5 or greater to improve ability to use LUE when lifting or carrying items during meal preparation/housework/yardwork tasks   Goal status: IN  PROGRESS  6.  Pt will increase left grip strength by 5# and pinch strength by 2# to improve ability to grasp and maintain hold on tools for ADL completion such as a cup, toothbrush, clothing, etc.   Goal status: IN PROGRESS   ASSESSMENT:  CLINICAL IMPRESSION: This session, pt requiring education and reassurance on completing certain aspects of his HEP. He feels that using the theraputty will make the weakness in his wrist worse. OT providing education that all of HEP is intended to improve strength and mobility, and only provided for home once it is ensured that his positioning and technique are good for independent completion and safe. OT adding rubber band exercises for continued finger strengthening. Verbal and tactile cuing provided throughout session for attending to tasks, positioning, and technique.  PERFORMANCE DEFICITS: in functional skills including ADLs, IADLs, coordination, dexterity, sensation, edema, ROM, strength, pain, fascial restrictions, Fine motor control, Gross motor control, body mechanics, and UE functional use.    PLAN:  OT FREQUENCY: 1-2x/week  OT DURATION: 6 weeks  PLANNED INTERVENTIONS: self care/ADL training, therapeutic exercise, therapeutic activity, manual therapy, scar mobilization, passive range of motion, functional mobility training, electrical stimulation, ultrasound, paraffin, moist heat, patient/family education, energy conservation, and coping strategies training  RECOMMENDED OTHER SERVICES: N/A  CONSULTED AND AGREED WITH PLAN OF CARE: Patient  PLAN FOR NEXT SESSION: Manual Therapy, P/ROM, stretching, Nerve Glides, A/ROM, grip and pinch strengthening   Trish Mage, OTR/L [facility] [phone]   Rosemarie Beath, OT 01/30/2023, 6:44 PM

## 2023-02-05 ENCOUNTER — Encounter (HOSPITAL_COMMUNITY): Payer: BLUE CROSS/BLUE SHIELD | Admitting: Occupational Therapy

## 2023-02-06 ENCOUNTER — Ambulatory Visit (HOSPITAL_COMMUNITY): Payer: BLUE CROSS/BLUE SHIELD | Admitting: Occupational Therapy

## 2023-02-06 ENCOUNTER — Encounter (HOSPITAL_COMMUNITY): Payer: Self-pay | Admitting: Occupational Therapy

## 2023-02-06 DIAGNOSIS — M25522 Pain in left elbow: Secondary | ICD-10-CM

## 2023-02-06 DIAGNOSIS — R29898 Other symptoms and signs involving the musculoskeletal system: Secondary | ICD-10-CM

## 2023-02-06 DIAGNOSIS — M25632 Stiffness of left wrist, not elsewhere classified: Secondary | ICD-10-CM

## 2023-02-06 DIAGNOSIS — M25532 Pain in left wrist: Secondary | ICD-10-CM | POA: Diagnosis not present

## 2023-02-06 NOTE — Therapy (Unsigned)
OUTPATIENT OCCUPATIONAL THERAPY ORTHO TREATMENT NOTE  Patient Name: Dalton Guerrero MRN: [id number] DOB:1961/03/24, 62 y.o., male Today's Date: 02/07/2023  PCP: Gwen Pounds, MD REFERRING PROVIDER: Joycelyn Das, PA  END OF SESSION:  OT End of Session - 02/06/23 1030     Visit Number 5    Number of Visits 7    Date for OT Re-Evaluation 02/23/23    Authorization Type BCBS    OT Start Time [phone]    OT Stop Time 1034    OT Time Calculation (min) 45 min    Activity Tolerance Patient tolerated treatment well    Behavior During Therapy WFL for tasks assessed/performed              Past Medical History:  Diagnosis Date   Hypertension    Past Surgical History:  Procedure Laterality Date   KIDNEY SURGERY Bilateral    Pt. 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   PRECAUTIONS: None  WEIGHT BEARING RESTRICTIONS: Yes <5lbs  PAIN:  Are you having pain? No  FALLS: Has patient fallen in last 6 months? No  LIVING ENVIRONMENT: Lives with: lives alone Lives in: House/apartment  PLOF: Independent  PATIENT GOALS: To get my hand moving better  NEXT MD VISIT: 02/2023  OBJECTIVE:   HAND DOMINANCE: Left  ADLs: Overall ADLs: Difficulty with self-care activities, difficulty with household chores, difficulty  opening containers, difficulty with resisted grip and pinch, difficulty with carrying task objects   FUNCTIONAL OUTCOME MEASURES: FOTO: Will complete next session  UPPER EXTREMITY ROM:     Active ROM Left eval  Elbow flexion 124  Elbow extension -16  Wrist flexion 72  Wrist extension 36  Wrist ulnar deviation 34  Wrist radial deviation 10  Wrist pronation WFL  Wrist supination WFL  (Blank rows = not tested)    Fist: 80% of a full fist, index finger unable to fully flex  UPPER EXTREMITY MMT:     MMT Left eval  Elbow flexion 4+/5  Elbow extension 4+/5  Wrist flexion 4+/5  Wrist extension 4/5  Wrist ulnar deviation 4+/5  Wrist radial deviation 4/5  Wrist pronation 4/5  Wrist supination 4+/5  (Blank rows = not tested)  HAND FUNCTION: Grip strength: Right: 70 lbs; Left: 34 lbs, Lateral pinch: Right: 19 lbs, Left: 12 lbs, and 3 point pinch: Right: 16 lbs, Left: 12 lbs  COORDINATION: 9 Hole Peg test: Right: 22.21 sec; Left: 31.76 sec  SENSATION: Light touch: Impaired  and from elbow down to fingers sensation is dull compared to RUE  EDEMA: Swelling noted around elbow.  OBSERVATIONS: moderate fascial restrictions noted around elbow and thumb.    TODAY'S TREATMENT:  DATE:   02/06/23 -Digit Stretches: flexion at each digit, extension at MCP's, 4x10" each -Digit ROM: composite flexion, finger taps, abduction, opposition, x10 -Wrist Strengthening: 3lbs, flexion, extension, ulnar/radial deviation, supination/pronation, x15 -Scrub and carry: 5lb dumbbells, 2x60" each -Gripper: 35lb picking up 5 medium beads  01/30/23 -Digit ROM: composite flexion, finger taps, abduction, opposition, x10 -Wrist Strengthening: 3lbs, flexion, extension, ulnar/radial deviation, supination/pronation, x15 -Pinch Strengthening: green, blue, and black resistance -Digit  Stretches: extension, flexion at MCP, PIP, and DIP, thumb abduction, 6x8-10" -Rubber Band Exercises: red band, digit abduction, digit flexion, digit extension, thumb abduction, x10 each  01/16/23 -P/ROM: flexion, extension, ulnar/radial deviation, supination/pronation, x8 -Digit ROM: composite flexion, finger taps, abduction, opposition, x10 -Gripping: 25 lbs, vertical picking up 10 medium beads, tried 29lbs -Wrist Strengthening: 3lbs, flexion, extension, ulnar/radial deviation, supination/pronation, x15 -Wrist ABC's: red weighted ball x2    PATIENT EDUCATION: Education details: Gripping and carrying up to 8lbs.  Person educated: Patient Education method: Explanation, Demonstration, and Handouts Education comprehension: verbalized understanding, returned demonstration, and needs further education  HOME EXERCISE PROGRAM: 6/21: Wrist A/ROM 6/28: Wrist Strengthening 7/2: Wrist ABC's 7/16: Rubber Band Exercises  GOALS: Goals reviewed with patient? Yes  SHORT TERM GOALS: Target date: 02/23/23  Pt will be provided with and educated on HEP to improve mobility in LUE required for use during ADL completion.   Goal status: IN PROGRESS  2.  Pt will decrease pain in LUE to 3/10 or less to improve ability to sleep for 2+ consecutive hours without waking due to pain.    Goal status: IN PROGRESS  3.  Pt will decrease LUE fascial restrictions to min amounts or less to improve mobility required for functional reaching tasks   Goal status: IN PROGRESS  4.  Pt will increase LUE ROM by 10 degrees in all motions to improve ability to manipulate items during dressing, bathing, and grooming.   Goal status: IN PROGRESS  5.  Pt will increase LUE strength to 5/5 or greater to improve ability to use LUE when lifting or carrying items during meal preparation/housework/yardwork tasks   Goal status: IN PROGRESS  6.  Pt will increase left grip strength by 5# and pinch strength by 2# to improve ability  to grasp and maintain hold on tools for ADL completion such as a cup, toothbrush, clothing, etc.   Goal status: IN PROGRESS   ASSESSMENT:  CLINICAL IMPRESSION: This session, pt continuing to work on his strength, as he feels that his wrist and middle of his palm are very weak. He had pain at the end of 1 min, holding the 8lb weight, however then tolerated scrubbing and weightbearing on his hand afterwards. With gripping, pt is able to tolerate increased weight of resistance, however continued decreased coordination with gripping medium and large beads. OT Providing verbal and visual cuing throughout session for positioning and technique.   PERFORMANCE DEFICITS: in functional skills including ADLs, IADLs, coordination, dexterity, sensation, edema, ROM, strength, pain, fascial restrictions, Fine motor control, Gross motor control, body mechanics, and UE functional use.    PLAN:  OT FREQUENCY: 1-2x/week  OT DURATION: 6 weeks  PLANNED INTERVENTIONS: self care/ADL training, therapeutic exercise, therapeutic activity, manual therapy, scar mobilization, passive range of motion, functional mobility training, electrical stimulation, ultrasound, paraffin, moist heat, patient/family education, energy conservation, and coping strategies training  RECOMMENDED OTHER SERVICES: N/A  CONSULTED AND AGREED WITH PLAN OF CARE: Patient  PLAN FOR NEXT SESSION: Manual Therapy, P/ROM, stretching, Nerve Glides, A/ROM, grip and pinch strengthening  Trish Mage, OTR/L [facility] [phone] Kennyth Arnold, OT 02/07/2023, 3:39 PM

## 2023-02-12 ENCOUNTER — Encounter (HOSPITAL_COMMUNITY): Payer: BLUE CROSS/BLUE SHIELD | Admitting: Occupational Therapy

## 2023-02-24 ENCOUNTER — Emergency Department (HOSPITAL_COMMUNITY): Payer: BLUE CROSS/BLUE SHIELD

## 2023-02-24 ENCOUNTER — Emergency Department (HOSPITAL_COMMUNITY)
Admission: EM | Admit: 2023-02-24 | Discharge: 2023-02-24 | Disposition: A | Discharge status: Home / Self Care | Payer: BLUE CROSS/BLUE SHIELD | Attending: Emergency Medicine | Admitting: Emergency Medicine

## 2023-02-24 ENCOUNTER — Encounter (HOSPITAL_COMMUNITY): Payer: Self-pay

## 2023-02-24 ENCOUNTER — Other Ambulatory Visit: Payer: Self-pay

## 2023-02-24 DIAGNOSIS — J209 Acute bronchitis, unspecified: Secondary | ICD-10-CM | POA: Insufficient documentation

## 2023-02-24 DIAGNOSIS — Z1152 Encounter for screening for COVID-19: Secondary | ICD-10-CM | POA: Diagnosis not present

## 2023-02-24 DIAGNOSIS — N189 Chronic kidney disease, unspecified: Secondary | ICD-10-CM | POA: Diagnosis not present

## 2023-02-24 DIAGNOSIS — R059 Cough, unspecified: Secondary | ICD-10-CM | POA: Diagnosis present

## 2023-02-24 LAB — COMPREHENSIVE METABOLIC PANEL
ALT: 29 U/L (ref 0–44)
AST: 28 U/L (ref 15–41)
Albumin: 3.9 g/dL (ref 3.5–5.0)
Alkaline Phosphatase: 115 U/L (ref 38–126)
Anion gap: 7 (ref 5–15)
BUN: 24 mg/dL — ABNORMAL HIGH (ref 8–23)
CO2: 28 mmol/L (ref 22–32)
Calcium: 9.3 mg/dL (ref 8.9–10.3)
Chloride: 103 mmol/L (ref 98–111)
Creatinine, Ser: 1.55 mg/dL — ABNORMAL HIGH (ref 0.61–1.24)
GFR, Estimated: 51 mL/min — ABNORMAL LOW (ref >60)
Glucose, Bld: 100 mg/dL — ABNORMAL HIGH (ref 70–99)
Potassium: 4.4 mmol/L (ref 3.5–5.1)
Sodium: 138 mmol/L (ref 135–145)
Total Bilirubin: 0.8 mg/dL (ref 0.3–1.2)
Total Protein: 8.2 g/dL — ABNORMAL HIGH (ref 6.5–8.1)

## 2023-02-24 LAB — CBC WITH DIFFERENTIAL/PLATELET
Abs Immature Granulocytes: 0.02 10*3/uL (ref 0.00–0.07)
Basophils Absolute: 0.1 10*3/uL (ref 0.0–0.1)
Basophils Relative: 1 %
Eosinophils Absolute: 0.2 10*3/uL (ref 0.0–0.5)
Eosinophils Relative: 3 %
HCT: 48.3 % (ref 39.0–52.0)
Hemoglobin: 15.2 g/dL (ref 13.0–17.0)
Immature Granulocytes: 0 %
Lymphocytes Relative: 26 %
Lymphs Abs: 2 10*3/uL (ref 0.7–4.0)
MCH: 30.5 pg (ref 26.0–34.0)
MCHC: 31.5 g/dL (ref 30.0–36.0)
MCV: 96.8 fL (ref 80.0–100.0)
Monocytes Absolute: 0.6 10*3/uL (ref 0.1–1.0)
Monocytes Relative: 7 %
Neutro Abs: 4.8 10*3/uL (ref 1.7–7.7)
Neutrophils Relative %: 63 %
Platelets: 250 10*3/uL (ref 150–400)
RBC: 4.99 MIL/uL (ref 4.22–5.81)
RDW: 13.1 % (ref 11.5–15.5)
WBC: 7.6 10*3/uL (ref 4.0–10.5)
nRBC: 0 % (ref 0.0–0.2)

## 2023-02-24 LAB — SARS CORONAVIRUS 2 BY RT PCR: SARS Coronavirus 2 by RT PCR: NEGATIVE

## 2023-02-24 NOTE — ED Notes (Signed)
Pt refuses to stay since COVID test was negative. Pt agreed to sign AMA form. EDP notified.

## 2023-02-24 NOTE — ED Triage Notes (Signed)
Pt was diagnosis with covid on 02/07/23 pt was placed on paxlovid dose pack for 5 days. Pt states he also had bronchitis, and was placed on doxy and prednisone which was ineffective so they placed him on amoxicillin, his is still on the amoxicillin. Pt is c/o a productive cough, labored breathing, diaphoresis, weakness, and insomnia.

## 2023-02-24 NOTE — ED Provider Notes (Signed)
[place] EMERGENCY DEPARTMENT AT [facility] Provider Note   CSN: [id number] Arrival date & time: 02/24/23  4098     History  Chief Complaint  Patient presents with   Covid Positive    Dalton Guerrero is a 62 y.o. male.  He has a history of sarcoidosis and is on prednisone.  He tested positive for COVID a few weeks ago, had dizziness loss of taste and smell, cough.  He saw his primary care doctor and went through a course of Paxlovid.  He is also done 2 courses of antibiotics for bronchitis.  Follows with a pulmonologist.  Still does not feel back to normal, cough productive of some greenish sputum, feeling feverish at times.  General fatigue and insomnia.  Wants to get tested for COVID again.  The history is provided by the patient.  Cough Cough characteristics:  Productive Sputum characteristics:  Green Severity:  Moderate Onset quality:  Gradual Duration:  3 weeks Timing:  Intermittent Progression:  Unchanged Chronicity:  Recurrent Smoker: no   Context: sick contacts   Relieved by:  Nothing Worsened by:  Activity Ineffective treatments: antibiotics, antivirals. Associated symptoms: diaphoresis, fever, shortness of breath and sore throat   Associated symptoms: no chest pain        Home Medications Prior to Admission medications   Not on File      Allergies    Patient has no known allergies.    Review of Systems   Review of Systems  Constitutional:  Positive for diaphoresis and fever.  HENT:  Positive for sore throat.   Respiratory:  Positive for cough and shortness of breath.   Cardiovascular:  Negative for chest pain.    Physical Exam Updated Vital Signs BP (!) 129/92 (BP Location: Left Arm)   Pulse 90   Temp 98.8 F (37.1 C) (Oral)   Resp 20   Ht 5\' 11"  (1.803 m)   Wt 96.6 kg   SpO2 100%   BMI 29.71 kg/m  Physical Exam Vitals and nursing note reviewed.  Constitutional:      General: He is not in acute distress.    Appearance:  Normal appearance. He is well-developed.  HENT:     Head: Normocephalic and atraumatic.  Eyes:     Conjunctiva/sclera: Conjunctivae normal.  Cardiovascular:     Rate and Rhythm: Normal rate and regular rhythm.     Heart sounds: No murmur heard. Pulmonary:     Effort: Pulmonary effort is normal. No respiratory distress.     Breath sounds: Normal breath sounds.  Abdominal:     Palpations: Abdomen is soft.     Tenderness: There is no abdominal tenderness. There is no guarding or rebound.  Musculoskeletal:        General: No deformity. Normal range of motion.     Cervical back: Neck supple.     Right lower leg: No edema.     Left lower leg: No edema.  Skin:    General: Skin is warm and dry.     Capillary Refill: Capillary refill takes less than 2 seconds.  Neurological:     General: No focal deficit present.     Mental Status: He is alert.     ED Results / Procedures / Treatments   Labs (all labs ordered are listed, but only abnormal results are displayed) Labs Reviewed  COMPREHENSIVE METABOLIC PANEL - Abnormal; Notable for the following components:      Result Value   Glucose, Bld 100 (*)  BUN 24 (*)    Creatinine, Ser 1.55 (*)    Total Protein 8.2 (*)    GFR, Estimated 51 (*)    All other components within normal limits  SARS CORONAVIRUS 2 BY RT PCR  CBC WITH DIFFERENTIAL/PLATELET    EKG None  Radiology DG Chest Port 1 View  Result Date: 02/24/2023 CLINICAL DATA:  cough, covid EXAM: PORTABLE CHEST 1 VIEW COMPARISON:  05/15/2022 chest radiograph. FINDINGS: Stable cardiomediastinal silhouette with normal heart size. No pneumothorax. No pleural effusion. No pulmonary edema. No acute consolidative airspace disease. Streaky curvilinear opacities and tram track lucencies throughout the lower lungs bilaterally, slightly more prominent. IMPRESSION: Streaky curvilinear opacities and tram track lucencies throughout the lower lungs bilaterally, slightly more prominent,  suggesting bronchiectasis, scarring and possible associated bronchiolitis. Consider short-term outpatient follow-up high-resolution chest CT. Electronically Signed   By: Delbert Phenix M.D.   On: 02/24/2023 11:06    Procedures Procedures    Medications Ordered in ED Medications - No data to display  ED Course/ Medical Decision Making/ A&P Clinical Course as of 02/24/23 1751  Sat Feb 24, 2023  1132 Baseline creatinine 1.8-2.0. [MB]    Clinical Course User Index [MB] Terrilee Files, MD                                 Medical Decision Making Amount and/or Complexity of Data Reviewed Labs: ordered. Radiology: ordered.   This patient complains of cough shortness of breath malaise; this involves an extensive number of treatment Options and is a complaint that carries with it a high risk of complications and morbidity. The differential includes COVID, pneumonia, bronchitis, dehydration  I ordered, reviewed and interpreted labs, which included CBC normal chemistries with chronic CKD COVID-negative I ordered imaging studies which included chest x-ray and I independently    visualized and interpreted imaging which showed rhonchitic changes in the bases Previous records obtained and reviewed in epic including family practice and rheumatology notes Cardiac monitoring reviewed, sinus rhythm Social determinants considered, no significant barriers Critical Interventions: None  After the interventions stated above, I reevaluated the patient and found patient had eloped Admission and further testing considered, patient eloped prior to discussion of final disposition         Final Clinical Impression(s) / ED Diagnoses Final diagnoses:  Acute bronchitis, unspecified organism    Rx / DC Orders ED Discharge Orders     None         Terrilee Files, MD 02/24/23 1753

## 2023-03-02 ENCOUNTER — Encounter (HOSPITAL_COMMUNITY): Payer: Self-pay | Admitting: Occupational Therapy

## 2023-03-02 ENCOUNTER — Ambulatory Visit (HOSPITAL_COMMUNITY): Payer: BLUE CROSS/BLUE SHIELD | Attending: Family Medicine | Admitting: Occupational Therapy

## 2023-03-02 DIAGNOSIS — M25532 Pain in left wrist: Secondary | ICD-10-CM | POA: Diagnosis present

## 2023-03-02 DIAGNOSIS — R29898 Other symptoms and signs involving the musculoskeletal system: Secondary | ICD-10-CM | POA: Insufficient documentation

## 2023-03-02 DIAGNOSIS — M25632 Stiffness of left wrist, not elsewhere classified: Secondary | ICD-10-CM | POA: Insufficient documentation

## 2023-03-02 NOTE — Therapy (Unsigned)
OUTPATIENT OCCUPATIONAL THERAPY ORTHO TREATMENT NOTE  Patient Name: Dalton Guerrero MRN: [id number] DOB:1960/09/18, 62 y.o., male Today's Date: 03/02/2023  PCP: Gwen Pounds, MD REFERRING PROVIDER: Joycelyn Das, PA  OCCUPATIONAL THERAPY DISCHARGE SUMMARY  Visits from Start of Care: 6  Current functional level related to goals / functional outcomes: Pt has met all OT goals. His ROM is WFL, his strength is 5/5, coordination is WFL, and overall ADL and IADL abilities is independent.    Remaining deficits: Pt has no remaining deficits at this time with his LUE.    Education / Equipment: Pt has been provided a comprehensive HEP to continue progressing and maintaining mobility and strength.    Plan: Patient agrees to discharge as he has met all OT goals.      END OF SESSION:   03/02/23 0912  OT Visits / Re-Eval  Visit Number 6  Number of Visits 7  Date for OT Re-Evaluation 02/23/23  Authorization  Authorization Type BCBS  OT Time Calculation  OT Start Time 0827  OT Stop Time 0911  OT Time Calculation (min) 44 min  End of Session  Activity Tolerance Patient tolerated treatment well  Behavior During Therapy WFL for tasks assessed/performed     Past Medical History:  Diagnosis Date   Hypertension    Past Surgical History:  Procedure Laterality Date   KIDNEY SURGERY Bilateral    Pt. 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   ONSET DATE: 12/05/22  REFERRING DIAG: s/p R cubital tunnel release  THERAPY DIAG:  No diagnosis found.  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   PRECAUTIONS: None  WEIGHT BEARING RESTRICTIONS: Yes <5lbs  PAIN:  Are you having pain? No  FALLS: Has patient fallen in last 6 months? No  LIVING ENVIRONMENT: Lives with: lives alone Lives in: House/apartment  PLOF: Independent  PATIENT GOALS: To get my hand moving better  NEXT MD VISIT: 02/2023  OBJECTIVE:   HAND DOMINANCE: Left  ADLs: Overall ADLs: Difficulty with self-care activities, difficulty with household chores, difficulty opening containers, difficulty with resisted grip and pinch, difficulty with carrying task objects   FUNCTIONAL OUTCOME MEASURES: FOTO: Will complete next session  UPPER EXTREMITY ROM:     Active ROM Left eval Left 03/02/23  Elbow flexion 124 139  Elbow extension -16 0  Wrist flexion 72 64  Wrist extension 36 38  Wrist ulnar deviation 34 37  Wrist radial deviation 10 22  Wrist pronation [facility] WFL  Wrist supination WFL WFL  (Blank rows = not tested)    Fist: 80% of a full fist, index finger unable to fully flex   Fist: (03/02/23),   UPPER EXTREMITY MMT:     MMT Left eval Left 03/02/23  Elbow flexion 4+/5 5/5  Elbow extension 4+/5 5/5  Wrist flexion 4+/5 5/5  Wrist extension 4/5 5/5  Wrist ulnar deviation 4+/5 5/5  Wrist radial deviation 4/5 5/5  Wrist pronation 4/5 5/5  Wrist supination 4+/5 5/5  (Blank rows = not tested)  HAND FUNCTION: Grip strength: Right: 70 lbs; Left: 34 lbs, Lateral pinch: Right: 19  lbs, Left: 12 lbs, and 3 point pinch: Right: 16 lbs, Left: 12 lbs Grip strength: (03/02/23) Left: 48 lbs, Lateral pinch: Left: 17 lbs, and 3 point pinch: Left: 13 lbs  COORDINATION: 9 Hole Peg test: Right: 22.21 sec; Left: 31.76 sec 9 Hole Peg test: (03/02/23) Left: 28.96 sec  SENSATION: Light touch: Impaired  and from elbow down to fingers sensation is dull compared to RUE  EDEMA: Swelling noted around elbow.  OBSERVATIONS: moderate fascial restrictions noted around elbow and thumb.     TODAY'S TREATMENT:                                                                                                                              DATE:   03/02/23 -Measurements for Reassessment -grip and pinch strength -9 hole peg test -Wrist A/ROM:  flexion, extension, ulnar/radial deviation, supination/pronation, x15 -Wrist Strengthening: 3lbs, flexion, extension, ulnar/radial deviation, supination/pronation, x15 -Digit ROM: composite flexion, finger taps, abduction, opposition, x10 -Digit Stretches: extension, flexion at MCP, PIP, and DIP, thumb abduction, 6x8-10"  02/06/23 -Digit Stretches: flexion at each digit, extension at MCP's, 4x10" each -Digit ROM: composite flexion, finger taps, abduction, opposition, x10 -Wrist Strengthening: 3lbs, flexion, extension, ulnar/radial deviation, supination/pronation, x15 -Scrub and carry: 5lb dumbbells, 2x60" each -Gripper: 35lb picking up 5 medium beads  01/30/23 -Digit ROM: composite flexion, finger taps, abduction, opposition, x10 -Wrist Strengthening: 3lbs, flexion, extension, ulnar/radial deviation, supination/pronation, x15 -Pinch Strengthening: green, blue, and black resistance -Digit Stretches: extension, flexion at MCP, PIP, and DIP, thumb abduction, 6x8-10" -Rubber Band Exercises: red band, digit abduction, digit flexion, digit extension, thumb abduction, x10 each   PATIENT EDUCATION: Education details: Review HEP Person educated: Patient Education method: Explanation, Demonstration, and Handouts Education comprehension: verbalized understanding, returned demonstration, and needs further education  HOME EXERCISE PROGRAM: 6/21: Wrist A/ROM 6/28: Wrist Strengthening 7/2: Wrist ABC's 7/16: Rubber Band Exercises  GOALS: Goals reviewed with patient? Yes  SHORT TERM GOALS: Target date: 02/23/23  Pt will be provided with and educated on HEP to improve mobility in LUE required for use during ADL completion.   Goal status:  MET  2.  Pt will decrease pain in LUE to 3/10 or less to improve ability to sleep for 2+ consecutive hours without waking due to pain.    Goal status: MET  3.  Pt will decrease LUE fascial restrictions to min amounts or less to improve mobility required for functional reaching tasks   Goal status: MET  4.  Pt will increase LUE ROM by 10 degrees in all motions to improve ability to manipulate items during dressing, bathing, and grooming.   Goal status: MET  5.  Pt will increase LUE strength to 5/5 or greater to improve ability to use LUE when lifting or carrying items during meal preparation/housework/yardwork tasks   Goal status: MET  6.  Pt will increase left grip strength by 5# and pinch strength by 2# to improve ability to grasp and maintain  hold on tools for ADL completion such as a cup, toothbrush, clothing, etc.   Goal status: MET   ASSESSMENT:  CLINICAL IMPRESSION: Pt completed reassessment this session where he demonstrated improved ROM and strength, as well as improved gripping, pinching, and coordination. Overall he is able to complete ADL's and IADL's independently and efficiently as needed. Pt has no further skilled OT needs at this time and will be discharged from OT.   PERFORMANCE DEFICITS: in functional skills including ADLs, IADLs, coordination, dexterity, sensation, edema, ROM, strength, pain, fascial restrictions, Fine motor control, Gross motor control, body mechanics, and UE functional use.    PLAN:  OT FREQUENCY: 1-2x/week  OT DURATION: 6 weeks  PLANNED INTERVENTIONS: self care/ADL training, therapeutic exercise, therapeutic activity, manual therapy, scar mobilization, passive range of motion, functional mobility training, electrical stimulation, ultrasound, paraffin, moist heat, patient/family education, energy conservation, and coping strategies training  RECOMMENDED OTHER SERVICES: N/A  CONSULTED AND AGREED WITH PLAN OF CARE: Patient  PLAN FOR NEXT  SESSION: discharge  Ellender Hose [facility] [phone] Shari Natt Rosemarie Beath, OT 03/02/2023, 8:28 AM

## 2023-06-14 IMAGING — DX DG SHOULDER 2+V*R*
3 series · 3 of 3 positions shown · non-contrast
Comparison: None.

CLINICAL DATA: Fall, shoulder pain

EXAM:
RIGHT SHOULDER - 2+ VIEW

[shoulder grashey]
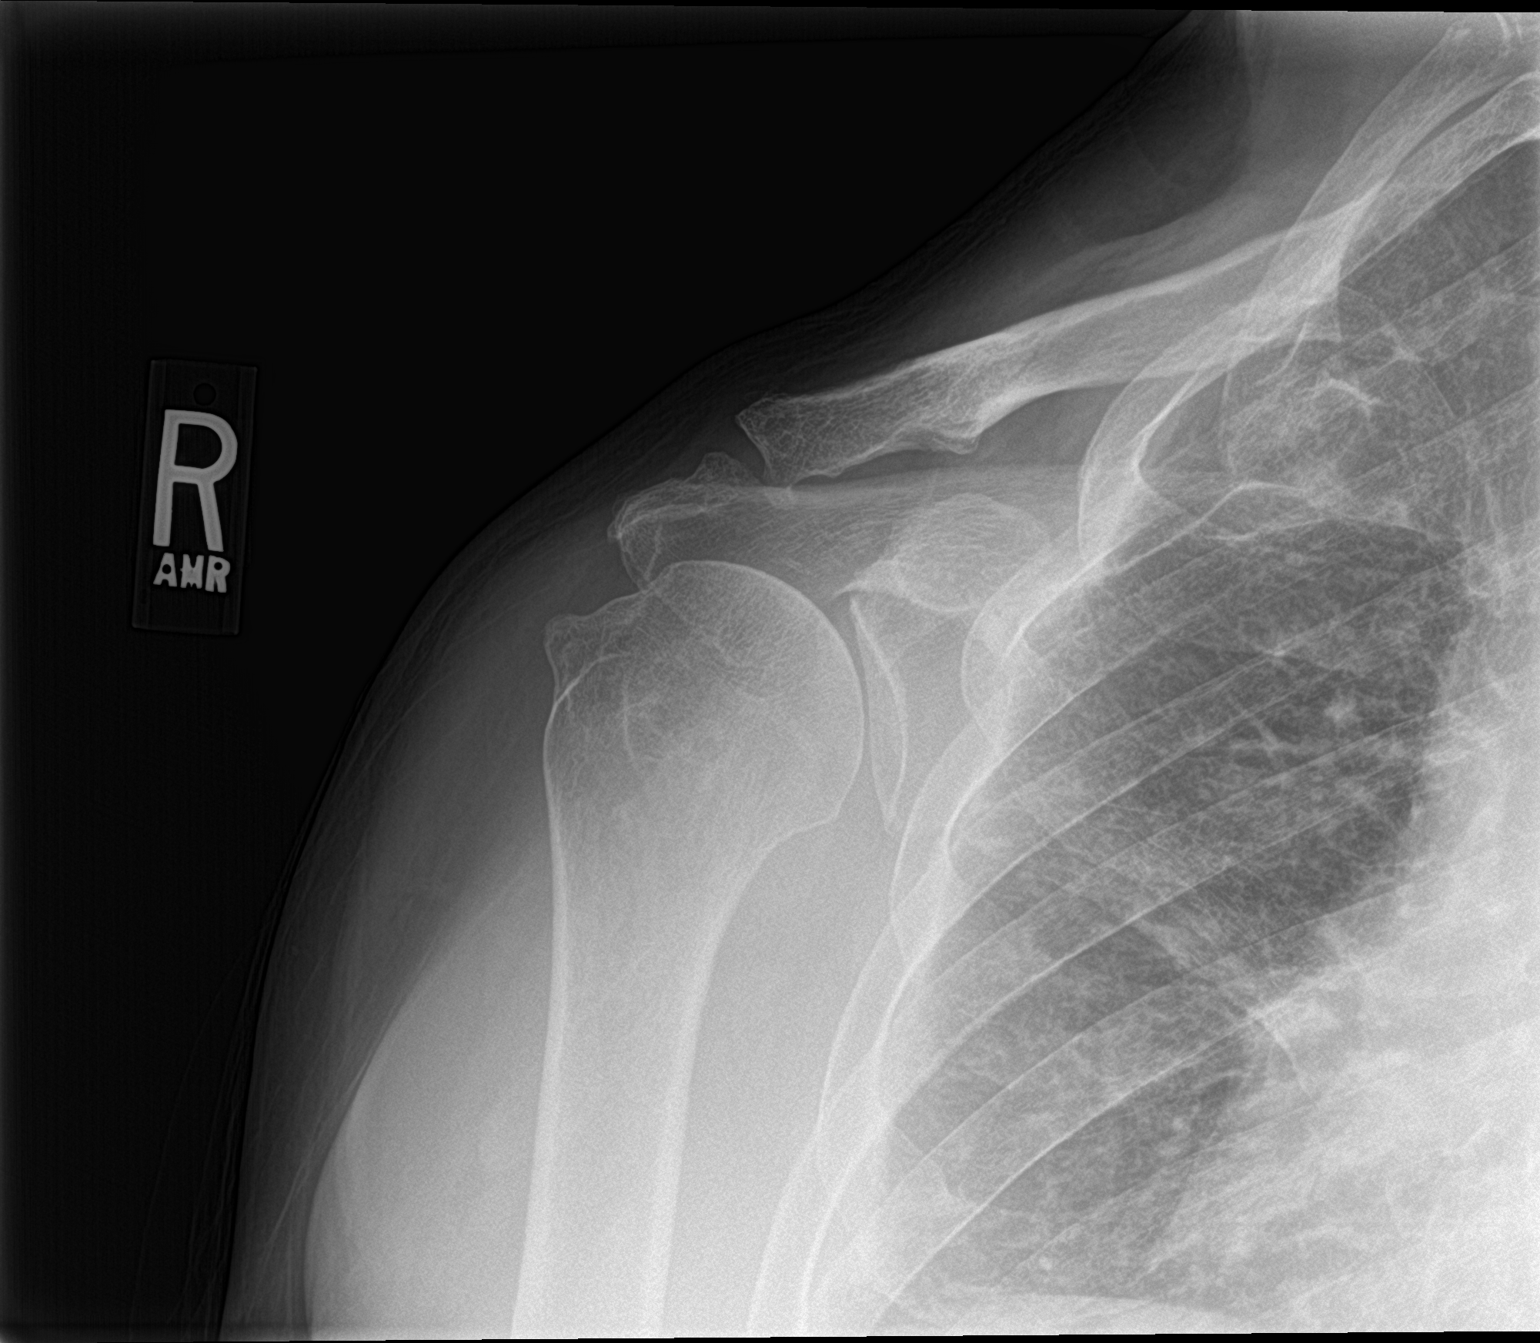

[shoulder y view]
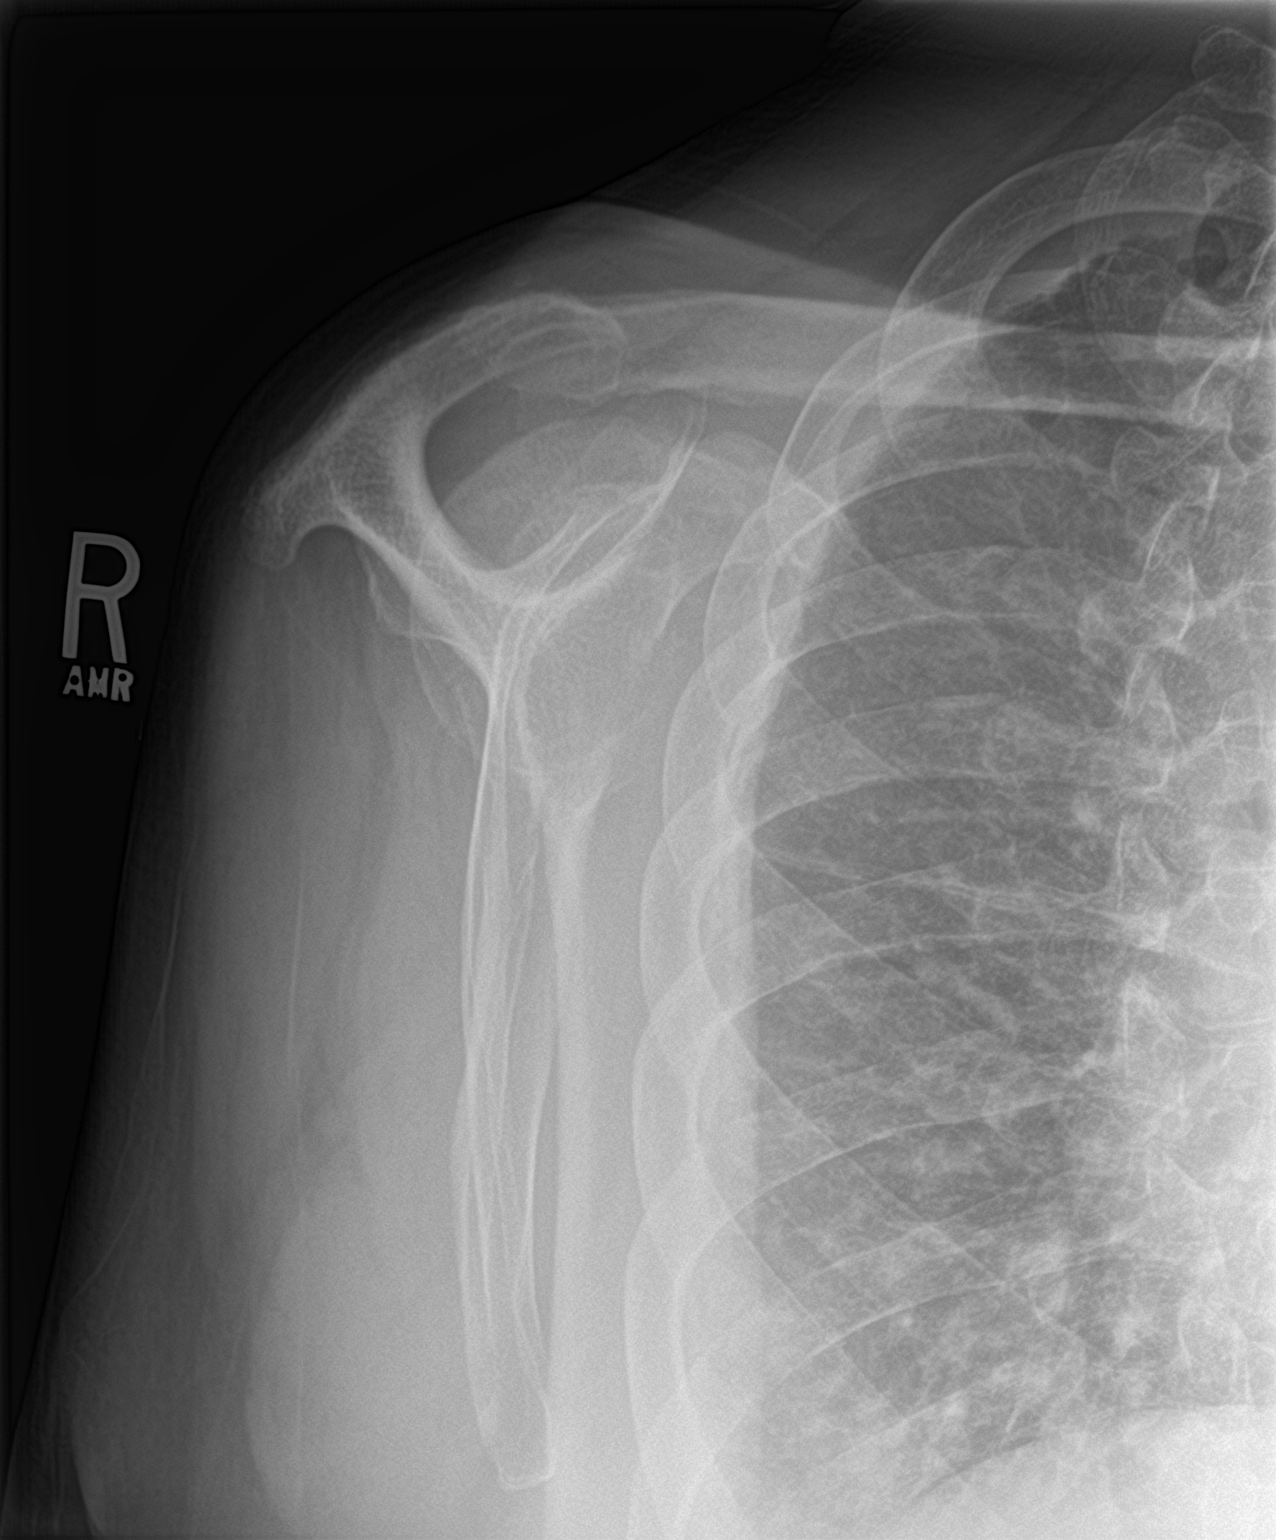

[shoulder axillary]
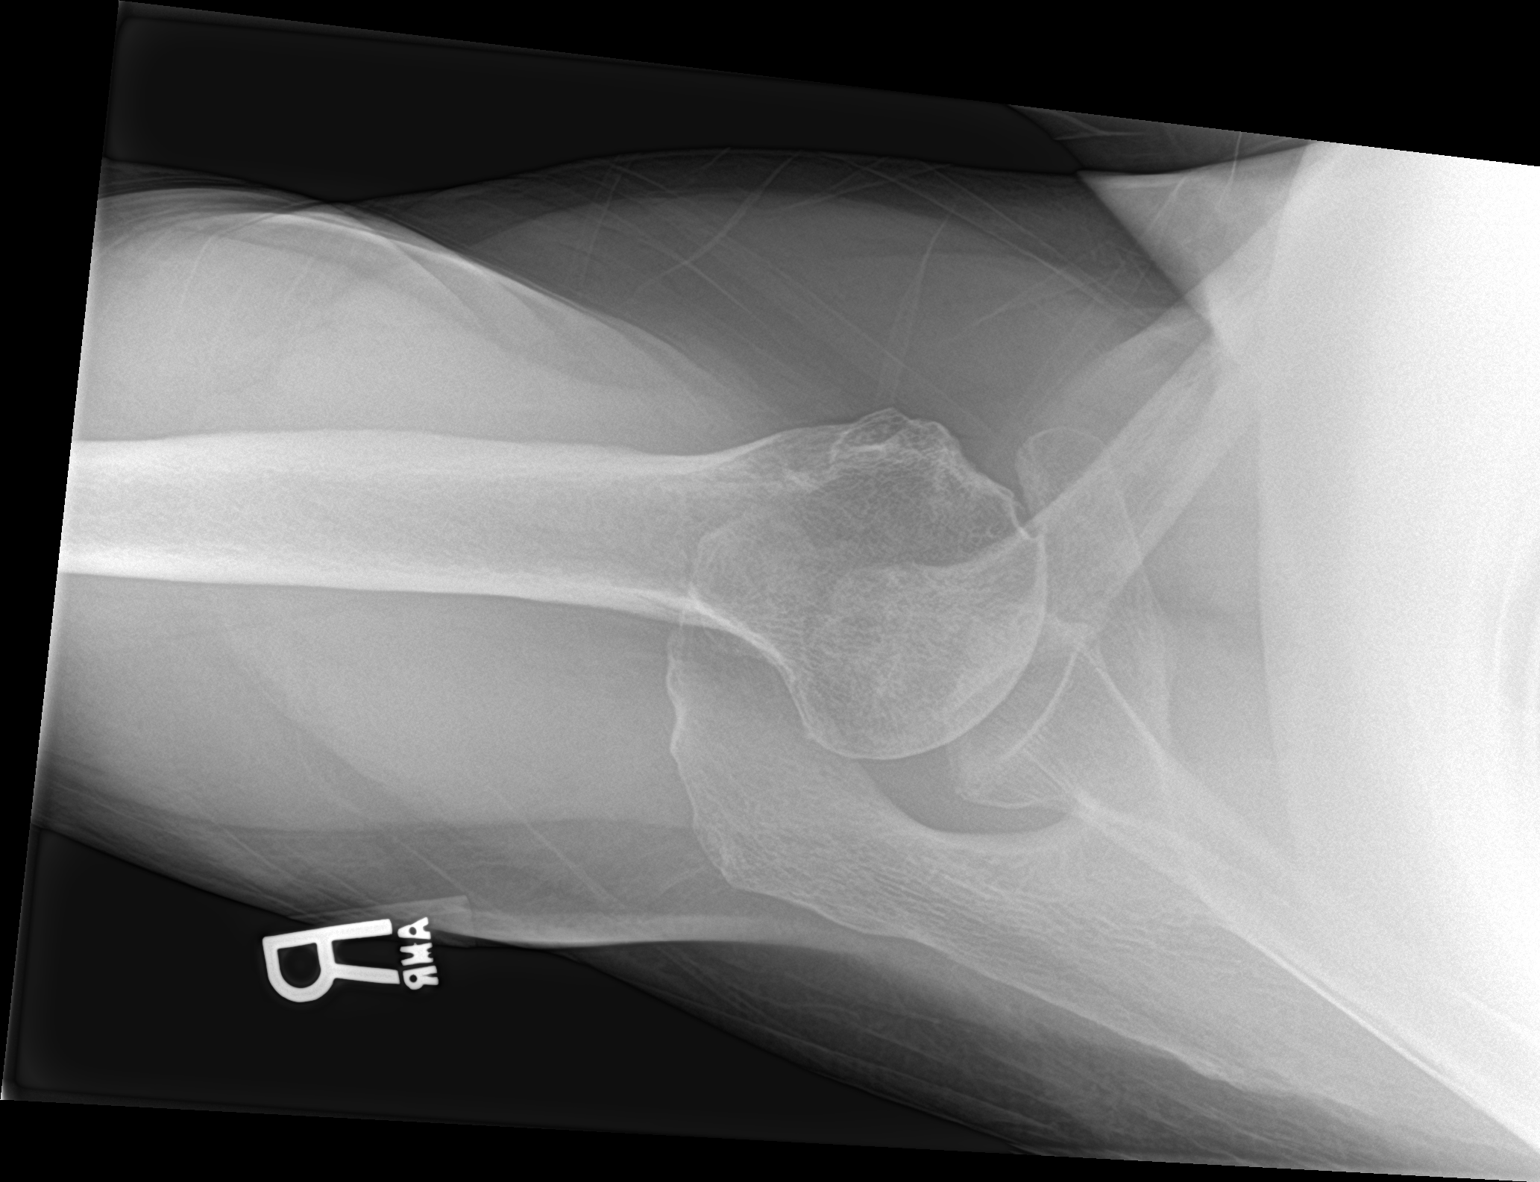

[3 of 3 positions shown; findings below may reference images not displayed]

FINDINGS: There is no acute fracture or dislocation. Glenohumeral and
acromioclavicular alignment is maintained. There is mild
degenerative change about the AC joint. The glenohumeral joint
appears preserved. The soft tissues are unremarkable.
IMPRESSION: No acute fracture or dislocation.

## 2023-08-05 ENCOUNTER — Emergency Department (HOSPITAL_COMMUNITY)
Admission: EM | Admit: 2023-08-05 | Discharge: 2023-08-05 | Disposition: A | Discharge status: Home / Self Care | Payer: Medicaid Other | Attending: Emergency Medicine | Admitting: Emergency Medicine

## 2023-08-05 ENCOUNTER — Emergency Department (HOSPITAL_COMMUNITY): Payer: Medicaid Other

## 2023-08-05 ENCOUNTER — Encounter (HOSPITAL_COMMUNITY): Payer: Self-pay | Admitting: Emergency Medicine

## 2023-08-05 ENCOUNTER — Other Ambulatory Visit: Payer: Self-pay

## 2023-08-05 DIAGNOSIS — Z85528 Personal history of other malignant neoplasm of kidney: Secondary | ICD-10-CM | POA: Insufficient documentation

## 2023-08-05 DIAGNOSIS — R112 Nausea with vomiting, unspecified: Secondary | ICD-10-CM | POA: Insufficient documentation

## 2023-08-05 DIAGNOSIS — R1013 Epigastric pain: Secondary | ICD-10-CM | POA: Insufficient documentation

## 2023-08-05 DIAGNOSIS — R1033 Periumbilical pain: Secondary | ICD-10-CM | POA: Diagnosis not present

## 2023-08-05 LAB — COMPREHENSIVE METABOLIC PANEL
ALT: 24 U/L (ref 0–44)
AST: 27 U/L (ref 15–41)
Albumin: 3.9 g/dL (ref 3.5–5.0)
Alkaline Phosphatase: 111 U/L (ref 38–126)
Anion gap: 8 (ref 5–15)
BUN: 28 mg/dL — ABNORMAL HIGH (ref 8–23)
CO2: 29 mmol/L (ref 22–32)
Calcium: 9.4 mg/dL (ref 8.9–10.3)
Chloride: 100 mmol/L (ref 98–111)
Creatinine, Ser: 1.5 mg/dL — ABNORMAL HIGH (ref 0.61–1.24)
GFR, Estimated: 53 mL/min — ABNORMAL LOW (ref >60)
Glucose, Bld: 101 mg/dL — ABNORMAL HIGH (ref 70–99)
Potassium: 3.7 mmol/L (ref 3.5–5.1)
Sodium: 137 mmol/L (ref 135–145)
Total Bilirubin: 0.9 mg/dL (ref 0.0–1.2)
Total Protein: 8 g/dL (ref 6.5–8.1)

## 2023-08-05 LAB — CBC WITH DIFFERENTIAL/PLATELET
Abs Immature Granulocytes: 0.02 10*3/uL (ref 0.00–0.07)
Basophils Absolute: 0.1 10*3/uL (ref 0.0–0.1)
Basophils Relative: 1 %
Eosinophils Absolute: 0.1 10*3/uL (ref 0.0–0.5)
Eosinophils Relative: 1 %
HCT: 47.4 % (ref 39.0–52.0)
Hemoglobin: 15.6 g/dL (ref 13.0–17.0)
Immature Granulocytes: 0 %
Lymphocytes Relative: 15 %
Lymphs Abs: 1.3 10*3/uL (ref 0.7–4.0)
MCH: 31 pg (ref 26.0–34.0)
MCHC: 32.9 g/dL (ref 30.0–36.0)
MCV: 94.2 fL (ref 80.0–100.0)
Monocytes Absolute: 0.6 10*3/uL (ref 0.1–1.0)
Monocytes Relative: 7 %
Neutro Abs: 6.6 10*3/uL (ref 1.7–7.7)
Neutrophils Relative %: 76 %
Platelets: 223 10*3/uL (ref 150–400)
RBC: 5.03 MIL/uL (ref 4.22–5.81)
RDW: 13.4 % (ref 11.5–15.5)
WBC: 8.7 10*3/uL (ref 4.0–10.5)
nRBC: 0 % (ref 0.0–0.2)

## 2023-08-05 LAB — LIPASE, BLOOD: Lipase: 39 U/L (ref 11–51)

## 2023-08-05 MED ORDER — ONDANSETRON HCL 4 MG/2ML IJ SOLN
4.0000 mg | Freq: Once | INTRAMUSCULAR | Status: AC
  Administered 2023-08-05: 4 mg via INTRAVENOUS
  Filled 2023-08-05: qty 2

## 2023-08-05 MED ORDER — ONDANSETRON 4 MG PO TBDP: 4.0000 mg | ORAL_TABLET | Freq: Three times a day (TID) | ORAL | 0 refills | Status: AC | PRN

## 2023-08-05 NOTE — ED Notes (Signed)
Patient given cup of water.

## 2023-08-05 NOTE — ED Triage Notes (Signed)
Pt c/o N/V since last night, denies diarrhea, states "I know its food poisoning because I was feeling fine until after eating that chinese food and then it all started a couple hours after eating it". States he has vomited a total of 6 times in the past two days. Denies shob, denies CP, denies dizziness

## 2023-08-05 NOTE — ED Notes (Signed)
Patient transported to CT 

## 2023-08-05 NOTE — Discharge Instructions (Signed)
Zofran is a medication which can help with nausea.  You may take 4 mg by mouth every 6 hours as needed if you are an adult, if your child under the age of 6 take half of a tablet or 2 mg every 6 hours as needed.  This should dissolve on your tongue within a short timeframe.  Wait about 30 minutes after taking it to help with drinking clear liquids.  Take your blood pressure medication when you get home about 1 hour after taking the Zofran  Thank you for allowing [place] to treat you in the emergency department today.  After reviewing your examination and potential testing that was done it appears that you are safe to go home.  I would like for you to follow-up with your doctor within the next several days, have them obtain your records and follow-up with them to review all potential tests and results from your visit.  If you should develop severe or worsening symptoms return to the emergency department immediately

## 2023-08-05 NOTE — ED Provider Notes (Signed)
[place] [facility] AT [facility] Provider Note   CSN: [id number] Arrival date & time: 08/05/23  4696     History  Chief Complaint  Patient presents with   Emesis    Dalton Guerrero is a 63 y.o. male.   Emesis  This patient is a 63 year old male with a history of sarcoidosis, primary pulmonary, history of renal cell carcinoma status post surgery in remission, history of ANCA associated vasculitis.  He presents after developing acute onset of nausea vomiting and mild abdominal discomfort which started in the last 24 hours.  He attributes this to eating some [place] food and then some [place] sausage which he normally does not eat.  Nobody else ate the same food, he has not had any diarrhea in fact he has not had a bowel movement in 3 days.  He denies swelling of the legs, denies fevers or chills, denies coughing or shortness of breath.    Home Medications Prior to Admission medications   Medication Sig Start Date End Date Taking? Authorizing Provider  ondansetron (ZOFRAN-ODT) 4 MG disintegrating tablet Take 1 tablet (4 mg total) by mouth every 8 (eight) hours as needed for nausea. 08/05/23  Yes Eber Hong, MD  ondansetron (ZOFRAN-ODT) 4 MG disintegrating tablet Take 1 tablet (4 mg total) by mouth every 8 (eight) hours as needed for nausea. 08/05/23  Yes Eber Hong, MD      Allergies    Patient has no known allergies.    Review of Systems   Review of Systems  Gastrointestinal:  Positive for vomiting.  All other systems reviewed and are negative.   Physical Exam Updated Vital Signs BP (!) 144/77   Pulse 98   Temp 98.5 F (36.9 C) (Oral)   SpO2 (!) 84%  Physical Exam Vitals and nursing note reviewed.  Constitutional:      General: He is not in acute distress.    Appearance: He is well-developed.  HENT:     Head: Normocephalic and atraumatic.     Mouth/Throat:     Pharynx: No oropharyngeal exudate.  Eyes:     General: No scleral icterus.        Right eye: No discharge.        Left eye: No discharge.     Conjunctiva/sclera: Conjunctivae normal.     Pupils: Pupils are equal, round, and reactive to light.  Neck:     Thyroid: No thyromegaly.     Vascular: No JVD.  Cardiovascular:     Rate and Rhythm: Normal rate and regular rhythm.     Heart sounds: Normal heart sounds. No murmur heard.    No friction rub. No gallop.  Pulmonary:     Effort: Pulmonary effort is normal. No respiratory distress.     Breath sounds: Normal breath sounds. No wheezing or rales.  Abdominal:     General: Bowel sounds are normal. There is no distension.     Palpations: Abdomen is soft. There is no mass.     Tenderness: There is abdominal tenderness.     Comments: Mild abdominal tenderness in the epigastric and periumbilical region, no lower abdominal tenderness, he is not peritoneal and has no guarding or pulsating mass, no CVA tenderness, no tympanitic sounds to percussion, no palpable umbilical or inguinal hernias  Musculoskeletal:        General: No tenderness. Normal range of motion.     Cervical back: Normal range of motion and neck supple.     Right lower  leg: No edema.     Left lower leg: No edema.  Lymphadenopathy:     Cervical: No cervical adenopathy.  Skin:    General: Skin is warm and dry.     Findings: No erythema or rash.  Neurological:     Mental Status: He is alert.     Coordination: Coordination normal.  Psychiatric:        Behavior: Behavior normal.     ED Results / Procedures / Treatments   Labs (all labs ordered are listed, but only abnormal results are displayed) Labs Reviewed  COMPREHENSIVE METABOLIC PANEL - Abnormal; Notable for the following components:      Result Value   Glucose, Bld 101 (*)    BUN 28 (*)    Creatinine, Ser 1.50 (*)    GFR, Estimated 53 (*)    All other components within normal limits  LIPASE, BLOOD  CBC WITH DIFFERENTIAL/PLATELET  CBC WITH DIFFERENTIAL/PLATELET  CBC WITH  DIFFERENTIAL/PLATELET    EKG None  Radiology CT ABDOMEN PELVIS WO CONTRAST Result Date: 08/05/2023 CLINICAL DATA:  Abdominal pain with nausea vomiting since last night. EXAM: CT ABDOMEN AND PELVIS WITHOUT CONTRAST TECHNIQUE: Multidetector CT imaging of the abdomen and pelvis was performed following the standard protocol without IV contrast. RADIATION DOSE REDUCTION: This exam was performed according to the departmental dose-optimization program which includes automated exposure control, adjustment of the mA and/or kV according to patient size and/or use of iterative reconstruction technique. COMPARISON:  None Available. FINDINGS: Lower chest: Bronchiectasis and chronic fibrotic changes at the lung bases. No acute findings. Hepatobiliary: No focal liver abnormality is seen. No gallstones, gallbladder wall thickening, or biliary dilatation. Pancreas: Unremarkable. No pancreatic ductal dilatation or surrounding inflammatory changes. Spleen: Normal in size without focal abnormality. Adrenals/Urinary Tract: Normal adrenal glands. Kidneys normal in overall size and position. Focal areas of cortical thinning consistent with scarring both kidneys with peripheral linear calcifications. Possible 2.3 cm posterior midpole right renal cyst, not well-defined. No other evidence of a renal mass, no stones and no hydronephrosis. Ureters are normal in course and in caliber. Bladder is unremarkable. Stomach/Bowel: Stomach is within normal limits. Appendix appears normal. No evidence of bowel wall thickening, distention, or inflammatory changes. Vascular/Lymphatic: Mild aortic atherosclerosis. No aneurysm. No enlarged lymph nodes. Reproductive: Unremarkable. Other: Small fat containing supraumbilical hernia.  No ascites. Musculoskeletal: No fracture or acute finding. No bone lesion. Dextroscoliosis of the upper lumbar spine. Diffuse disc degenerative and lower lumbar spine facet degenerative changes. IMPRESSION: 1. No acute  findings within the abdomen or pelvis. No findings to account for the patient's symptoms. 2. Aortic atherosclerosis. Aortic Atherosclerosis (ICD10-I70.0). Electronically Signed   By: Amie Portland M.D.   On: 08/05/2023 10:18    Procedures Procedures    Medications Ordered in ED Medications  ondansetron (ZOFRAN) injection 4 mg (4 mg Intravenous Given 08/05/23 1027)    ED Course/ Medical Decision Making/ A&P                                 Medical Decision Making Amount and/or Complexity of Data Reviewed Labs: ordered. Radiology: ordered.  Risk Prescription drug management.   This patient is in no distress but has had some persistent vomiting overnight with increasing abdominal pain.  With his history will obtain labs and a CT scan, he has recently had a clear workup for remission of his renal cell carcinoma but would certainly be at risk  for other complications given his vasculitis sarcoid history as well as postsurgical complications from his surgical treatment of his cancer.  Vitals are reassuring, heart rate of 95, no fever, no hypotension.  Labs and CT scan.  Will do CT without contrast given his prior renal function, most recent creatinine was 1.76 as of November based on my medical record review  Labs: I personally viewed and interpreted labs which show baseline renal insufficiency, no electrolyte disturbances, normal CBC including no leukocytosis  Imaging: I personally viewed and interpreted CT scan of the abdomen and pelvis which was obtained due to some abdominal discomfort and vomiting.  Thankfully there is no findings of acute bowel obstruction or other surgical causes, I agree with radiologist interpretation.  Medication management: The patient was given a dose of Zofran with significant improvement, tolerating fluids without difficulty  ED course: Stable for discharge, vitals normal, understands indications for return.  I have discussed with the patient at the bedside  the results, and the meaning of these results.  They have had opportunity to ask questions,  expressed their understanding to the need for follow-up with primary care physician        Final Clinical Impression(s) / ED Diagnoses Final diagnoses:  Nausea and vomiting, unspecified vomiting type    Rx / DC Orders ED Discharge Orders          Ordered    ondansetron (ZOFRAN-ODT) 4 MG disintegrating tablet  Every 8 hours PRN        08/05/23 1329    ondansetron (ZOFRAN-ODT) 4 MG disintegrating tablet  Every 8 hours PRN        08/05/23 1334              Eber Hong, MD 08/05/23 1336

## 2023-08-15 ENCOUNTER — Emergency Department (HOSPITAL_COMMUNITY)
Admission: EM | Admit: 2023-08-15 | Discharge: 2023-08-15 | Discharge status: Against Medical Advice | Payer: Medicaid Other | Attending: Emergency Medicine | Admitting: Emergency Medicine

## 2023-08-15 ENCOUNTER — Encounter (HOSPITAL_COMMUNITY): Payer: Self-pay

## 2023-08-15 ENCOUNTER — Other Ambulatory Visit: Payer: Self-pay

## 2023-08-15 DIAGNOSIS — R42 Dizziness and giddiness: Secondary | ICD-10-CM | POA: Insufficient documentation

## 2023-08-15 DIAGNOSIS — Z5321 Procedure and treatment not carried out due to patient leaving prior to being seen by health care provider: Secondary | ICD-10-CM | POA: Diagnosis not present

## 2023-08-15 DIAGNOSIS — R519 Headache, unspecified: Secondary | ICD-10-CM | POA: Insufficient documentation

## 2023-08-15 HISTORY — DX: Hyperlipidemia, unspecified: E78.5

## 2023-08-15 LAB — CBC WITH DIFFERENTIAL/PLATELET
Abs Immature Granulocytes: 0.02 10*3/uL (ref 0.00–0.07)
Basophils Absolute: 0.1 10*3/uL (ref 0.0–0.1)
Basophils Relative: 1 %
Eosinophils Absolute: 0.3 10*3/uL (ref 0.0–0.5)
Eosinophils Relative: 4 %
HCT: 45.2 % (ref 39.0–52.0)
Hemoglobin: 15 g/dL (ref 13.0–17.0)
Immature Granulocytes: 0 %
Lymphocytes Relative: 23 %
Lymphs Abs: 1.8 10*3/uL (ref 0.7–4.0)
MCH: 31.5 pg (ref 26.0–34.0)
MCHC: 33.2 g/dL (ref 30.0–36.0)
MCV: 95 fL (ref 80.0–100.0)
Monocytes Absolute: 0.7 10*3/uL (ref 0.1–1.0)
Monocytes Relative: 9 %
Neutro Abs: 5.1 10*3/uL (ref 1.7–7.7)
Neutrophils Relative %: 63 %
Platelets: 211 10*3/uL (ref 150–400)
RBC: 4.76 MIL/uL (ref 4.22–5.81)
RDW: 13.3 % (ref 11.5–15.5)
WBC: 8 10*3/uL (ref 4.0–10.5)
nRBC: 0 % (ref 0.0–0.2)

## 2023-08-15 LAB — BASIC METABOLIC PANEL
Anion gap: 9 (ref 5–15)
BUN: 33 mg/dL — ABNORMAL HIGH (ref 8–23)
CO2: 27 mmol/L (ref 22–32)
Calcium: 9.1 mg/dL (ref 8.9–10.3)
Chloride: 100 mmol/L (ref 98–111)
Creatinine, Ser: 1.74 mg/dL — ABNORMAL HIGH (ref 0.61–1.24)
GFR, Estimated: 44 mL/min — ABNORMAL LOW (ref >60)
Glucose, Bld: 117 mg/dL — ABNORMAL HIGH (ref 70–99)
Potassium: 4.1 mmol/L (ref 3.5–5.1)
Sodium: 136 mmol/L (ref 135–145)

## 2023-08-15 NOTE — ED Triage Notes (Signed)
Pt arrived via POV c/o on-going headache for the past few days. Pt reports he is waking up with headaches and OTC medications are not providing relief. Pt endorses dizziness.

## 2023-08-15 NOTE — ED Notes (Signed)
Pt very talkative in Triage and difficult to redirect to Triage questions.

## 2023-11-05 ENCOUNTER — Encounter (HOSPITAL_COMMUNITY): Payer: Self-pay | Admitting: Occupational Therapy

## 2023-11-05 ENCOUNTER — Other Ambulatory Visit: Payer: Self-pay

## 2023-11-05 ENCOUNTER — Ambulatory Visit (HOSPITAL_COMMUNITY): Admitting: Occupational Therapy

## 2023-11-05 DIAGNOSIS — R29898 Other symptoms and signs involving the musculoskeletal system: Secondary | ICD-10-CM | POA: Insufficient documentation

## 2023-11-05 DIAGNOSIS — G8929 Other chronic pain: Secondary | ICD-10-CM | POA: Insufficient documentation

## 2023-11-05 DIAGNOSIS — M25511 Pain in right shoulder: Secondary | ICD-10-CM | POA: Diagnosis present

## 2023-11-05 NOTE — Therapy (Signed)
 OUTPATIENT OCCUPATIONAL THERAPY ORTHO EVALUATION  Patient Name: Dalton Guerrero MRN: [id number] DOB:09-09-1960, 63 y.o., male Today's Date: 11/05/2023   END OF SESSION:  OT End of Session - 11/05/23 1031     Visit Number 1    Number of Visits 1    Date for OT Re-Evaluation 11/06/23    Authorization Type [place] Complete Medicaid    OT Start Time [phone]    OT Stop Time 1008    OT Time Calculation (min) 31 min    Activity Tolerance Patient tolerated treatment well    Behavior During Therapy WFL for tasks assessed/performed             Past Medical History:  Diagnosis Date   Hyperlipidemia    Hypertension    Past Surgical History:  Procedure Laterality Date   KIDNEY SURGERY Bilateral    Pt. states "they took the cancer out"   PELVIS CLOSED REDUCTION     There are no active problems to display for this patient.   PCP: Dr. Jerrlyn Morel REFERRING PROVIDER: Dr. Johnsie Nani  ONSET DATE: chronic-July/August 2023  REFERRING DIAG: chronic right RC tear  THERAPY DIAG:  Other symptoms and signs involving the musculoskeletal system  Chronic right shoulder pain  Rationale for Evaluation and Treatment: Rehabilitation  SUBJECTIVE:   SUBJECTIVE STATEMENT: S: I just need some exercises I can do at home.  Pt accompanied by: self  PERTINENT HISTORY: Pt is a 63 y/o male s/p fall at work in 2023 with a non-operable RC tear in the RUE. Pt presents for an HEP to stretch his shoulder at home.   PRECAUTIONS: None  WEIGHT BEARING RESTRICTIONS: No  PAIN:  Are you having pain? Yes: NPRS scale: 7/10 Pain location: right shoulder Pain description: sore, aching Aggravating factors: lifting items Relieving factors: rest  FALLS: Has patient fallen in last 6 months? No  PLOF: Independent  PATIENT GOALS: To have more use of the right shoulder.   NEXT MD VISIT: unsure  OBJECTIVE:   HAND DOMINANCE: Left  ADLs: Overall ADLs: Pt is having difficulty with reaching and  lifting, housekeeping tasks, and outdoor work. Sleep is difficulty. Pt with pain during majority of tasks.   FUNCTIONAL OUTCOME MEASURES: Upper Extremity Functional Scale (UEFS): 42/80  UPPER EXTREMITY ROM:       Assessed in sitting, er/IR adducted  Active ROM Right eval  Shoulder flexion 165  Shoulder abduction 153  Shoulder internal rotation 90  Shoulder external rotation 52  (Blank rows = not tested)    UPPER EXTREMITY MMT:     Assessed in sitting, er/IR adducted  MMT Right eval  Shoulder flexion 4+/5  Shoulder abduction 4/5  Shoulder internal rotation 5/5  Shoulder external rotation 4/5  (Blank rows = not tested)  SENSATION: WFL  EDEMA: None  COGNITION: Overall cognitive status: Within functional limits for tasks assessed   TODAY'S TREATMENT:  DATE:  -HEP: red theraband strengthening-protraction, flexion, abduction, horizontal abduction, er, IR, 10 reps    PATIENT EDUCATION: Education details: red theraband strengthening Person educated: Patient Education method: Explanation, Demonstration, and Handouts Education comprehension: verbalized understanding and returned demonstration  HOME EXERCISE PROGRAM: Eval: red theraband strengthening  GOALS: Goals reviewed with patient? Yes   SHORT TERM GOALS: Target date: 11/05/23  Pt will be provided with and educated on HEP to improve mobility and strength in RUE required for use during ADL completion.   Goal status: INITIAL   ASSESSMENT:  CLINICAL IMPRESSION: Patient is a 63 y.o. male who was seen today for occupational therapy evaluation for right shoulder RC tear. Pt presents with increased pain, decreased strength, and functional use of the RUE. Pt would like HEP for shoulder strengthening to improve ability to use RUE during ADLs. Provided HEP and pt completing with good form and  technique, discussed how to modify for more or less resistance.   PERFORMANCE DEFICITS: in functional skills including in functional skills including ADLs, IADLs, coordination, tone, ROM, strength, pain, fascial restrictions, muscle spasms, and UE functional use  IMPAIRMENTS: are limiting patient from ADLs, IADLs, rest and sleep, and leisure.   COMORBIDITIES: may have co-morbidities  that affects occupational performance. Patient will benefit from skilled OT to address above impairments and improve overall function.  MODIFICATION OR ASSISTANCE TO COMPLETE EVALUATION: No modification of tasks or assist necessary to complete an evaluation.  OT OCCUPATIONAL PROFILE AND HISTORY: Problem focused assessment: Including review of records relating to presenting problem.  CLINICAL DECISION MAKING: LOW - limited treatment options, no task modification necessary  REHAB POTENTIAL: Good  EVALUATION COMPLEXITY: Low      PLAN:  OT FREQUENCY: one time visit  OT DURATION: 1 week  PLANNED INTERVENTIONS: 97168 OT Re-evaluation, 97535 self care/ADL training, 65784 therapeutic exercise, 97530 therapeutic activity, 97112 neuromuscular re-education, 97140 manual therapy, patient/family education, and DME and/or AE instructions  RECOMMENDED OTHER SERVICES: None at this time  CONSULTED AND AGREED WITH PLAN OF CARE: Patient  PLAN FOR NEXT SESSION: N/A, one time visit for HEP   Lafonda Piety, OTR/L  [phone] 11/05/2023, 10:32 AM

## 2023-11-05 NOTE — Patient Instructions (Signed)

## 2023-11-29 ENCOUNTER — Other Ambulatory Visit: Payer: Self-pay

## 2023-11-29 ENCOUNTER — Encounter (HOSPITAL_COMMUNITY): Payer: Self-pay

## 2023-11-29 ENCOUNTER — Emergency Department (HOSPITAL_COMMUNITY)
Admission: EM | Admit: 2023-11-29 | Discharge: 2023-11-29 | Discharge status: Against Medical Advice | Attending: Emergency Medicine | Admitting: Emergency Medicine

## 2023-11-29 DIAGNOSIS — Z5321 Procedure and treatment not carried out due to patient leaving prior to being seen by health care provider: Secondary | ICD-10-CM | POA: Diagnosis not present

## 2023-11-29 DIAGNOSIS — R109 Unspecified abdominal pain: Secondary | ICD-10-CM | POA: Diagnosis present

## 2023-11-29 DIAGNOSIS — R319 Hematuria, unspecified: Secondary | ICD-10-CM | POA: Insufficient documentation

## 2023-11-29 HISTORY — DX: Benign prostatic hyperplasia without lower urinary tract symptoms: N40.0

## 2023-11-29 HISTORY — DX: Other nonrheumatic aortic valve disorders: I35.8

## 2023-11-29 HISTORY — DX: Chronic kidney disease, unspecified: N18.9

## 2023-11-29 HISTORY — DX: Primary osteoarthritis, right shoulder: M19.011

## 2023-11-29 HISTORY — DX: Disorder of kidney and ureter, unspecified: N28.9

## 2023-11-29 HISTORY — DX: Malignant neoplasm of right kidney, except renal pelvis: C64.1

## 2023-11-29 HISTORY — DX: Sarcoidosis of lung: D86.0

## 2023-11-29 LAB — COMPREHENSIVE METABOLIC PANEL WITH GFR
ALT: 20 U/L (ref 0–44)
AST: 17 U/L (ref 15–41)
Albumin: 3.4 g/dL — ABNORMAL LOW (ref 3.5–5.0)
Alkaline Phosphatase: 91 U/L (ref 38–126)
Anion gap: 10 (ref 5–15)
BUN: 25 mg/dL — ABNORMAL HIGH (ref 8–23)
CO2: 25 mmol/L (ref 22–32)
Calcium: 9.4 mg/dL (ref 8.9–10.3)
Chloride: 100 mmol/L (ref 98–111)
Creatinine, Ser: 1.59 mg/dL — ABNORMAL HIGH (ref 0.61–1.24)
GFR, Estimated: 49 mL/min — ABNORMAL LOW (ref >60)
Glucose, Bld: 147 mg/dL — ABNORMAL HIGH (ref 70–99)
Potassium: 4.2 mmol/L (ref 3.5–5.1)
Sodium: 135 mmol/L (ref 135–145)
Total Bilirubin: 0.6 mg/dL (ref 0.0–1.2)
Total Protein: 8.3 g/dL — ABNORMAL HIGH (ref 6.5–8.1)

## 2023-11-29 LAB — URINALYSIS, ROUTINE W REFLEX MICROSCOPIC
Bilirubin Urine: NEGATIVE
Glucose, UA: NEGATIVE mg/dL
Ketones, ur: NEGATIVE mg/dL
Nitrite: NEGATIVE
Protein, ur: 30 mg/dL — AB
Specific Gravity, Urine: 1.004 — ABNORMAL LOW (ref 1.005–1.030)
WBC, UA: >50 WBC/hpf (ref 0–5)
pH: 6 (ref 5.0–8.0)

## 2023-11-29 LAB — CBC WITH DIFFERENTIAL/PLATELET
Abs Immature Granulocytes: 0.06 10*3/uL (ref 0.00–0.07)
Basophils Absolute: 0 10*3/uL (ref 0.0–0.1)
Basophils Relative: 0 %
Eosinophils Absolute: 0 10*3/uL (ref 0.0–0.5)
Eosinophils Relative: 0 %
HCT: 44.9 % (ref 39.0–52.0)
Hemoglobin: 14.6 g/dL (ref 13.0–17.0)
Immature Granulocytes: 0 %
Lymphocytes Relative: 4 %
Lymphs Abs: 0.7 10*3/uL (ref 0.7–4.0)
MCH: 31.5 pg (ref 26.0–34.0)
MCHC: 32.5 g/dL (ref 30.0–36.0)
MCV: 96.8 fL (ref 80.0–100.0)
Monocytes Absolute: 0.2 10*3/uL (ref 0.1–1.0)
Monocytes Relative: 1 %
Neutro Abs: 16.4 10*3/uL — ABNORMAL HIGH (ref 1.7–7.7)
Neutrophils Relative %: 95 %
Platelets: 331 10*3/uL (ref 150–400)
RBC: 4.64 MIL/uL (ref 4.22–5.81)
RDW: 13.3 % (ref 11.5–15.5)
WBC: 17.4 10*3/uL — ABNORMAL HIGH (ref 4.0–10.5)
nRBC: 0 % (ref 0.0–0.2)

## 2023-11-29 LAB — LIPASE, BLOOD: Lipase: 38 U/L (ref 11–51)

## 2023-11-29 NOTE — ED Triage Notes (Signed)
 Pt arrived via POV c/o bilateral flank pain and reports he noticed hematuria began 2 weeks ago. Pt concerned for sarcoidosis and reports he has had previous surgeries on his kidneys.

## 2023-11-29 NOTE — ED Notes (Signed)
 Pt called multiple times for IV placement and for room with no response

## 2024-01-01 ENCOUNTER — Ambulatory Visit
Admission: EM | Admit: 2024-01-01 | Discharge: 2024-01-01 | Discharge status: Against Medical Advice | Attending: Nurse Practitioner | Admitting: Nurse Practitioner

## 2024-01-01 DIAGNOSIS — R109 Unspecified abdominal pain: Secondary | ICD-10-CM | POA: Insufficient documentation

## 2024-01-01 DIAGNOSIS — R5383 Other fatigue: Secondary | ICD-10-CM | POA: Insufficient documentation

## 2024-01-01 LAB — POCT URINALYSIS DIP (MANUAL ENTRY)
Glucose, UA: NEGATIVE mg/dL
Ketones, POC UA: NEGATIVE mg/dL
Leukocytes, UA: NEGATIVE
Nitrite, UA: NEGATIVE
Spec Grav, UA: <=1.005 — AB (ref 1.010–1.025)
Urobilinogen, UA: 0.2 U/dL
pH, UA: 6 (ref 5.0–8.0)

## 2024-01-01 NOTE — ED Triage Notes (Incomplete)
 Pt reports he is having bilateral kidney pain and yellow liquid BM x since this morning

## 2024-01-01 NOTE — ED Provider Notes (Signed)
 [facility]    CSN: [id number] Arrival date & time: 01/01/24  1224      History   Chief Complaint Chief Complaint  Patient presents with   Flank Pain    HPI Dalton Guerrero is a 63 y.o. male.   Patient presents today with complaints of side pain and insomnia ongoing for the past couple of days.  Reports for the past 8 weeks, he has been treated intermittently for urinary tract infections, pyelonephritis with multiple rounds of antibiotics.  During those episodes, he would notice blood in his urine but he denies any blood in his urine today.  No increased urinary frequency, urgency, or burning with urination.  No testicular pain, groin swelling, or penile discharge.  He also has a sleep study scheduled for the next few days and reports he is trying to come off of amitriptyline that he has been on for years and he is not sleeping well.  He feels overall very weak, exhausted, tired, and unwell.  Reports this morning, he had a loose bowel movement but denies any current abdominal pain, recent fever, body aches, chills, night sweats, recent unexplained weight loss.  Reports his appetite has been decreased for the past few days.  No nausea or vomiting.  Patient follows with multiple specialists including urology, rheumatology, and sleep medicine.  He also has a primary care provider that he sees regularly.    Past Medical History:  Diagnosis Date   Aortic valve sclerosis    BPH (benign prostatic hyperplasia)    Chronic kidney disease    Hyperlipidemia    Hypertension    Primary osteoarthritis of right shoulder    Pulmonary sarcoidosis (HCC)    Renal cell carcinoma, right (HCC)    Renal disorder     There are no active problems to display for this patient.   Past Surgical History:  Procedure Laterality Date   KIDNEY SURGERY Bilateral    Pt. states they took the cancer out   PELVIS CLOSED REDUCTION         Home Medications    Prior to Admission medications    Medication Sig Start Date End Date Taking? Authorizing Provider  amitriptyline (ELAVIL) 50 MG tablet Take 50 mg by mouth at bedtime. 04/16/23   [provider]  amLODipine (NORVASC) 10 MG tablet Take 10 mg by mouth daily. 03/01/23   [provider]  aspirin 81 MG chewable tablet Chew 81 mg by mouth daily. 04/18/23   [provider]  atorvastatin (LIPITOR) 40 MG tablet Take 1 tablet by mouth daily. 04/18/23   [provider]  hydrochlorothiazide (HYDRODIURIL) 25 MG tablet Take 25 mg by mouth daily. 04/16/23   [provider]  lisinopril (ZESTRIL) 10 MG tablet Take 10 mg by mouth daily.    [provider]  LYRICA 25 MG capsule Take 25 mg by mouth daily. 07/30/23   [provider]  ondansetron  (ZOFRAN -ODT) 4 MG disintegrating tablet Take 1 tablet (4 mg total) by mouth every 8 (eight) hours as needed for nausea. 08/05/23   Early Glisson, MD  ondansetron  (ZOFRAN -ODT) 4 MG disintegrating tablet Take 1 tablet (4 mg total) by mouth every 8 (eight) hours as needed for nausea. 08/05/23   Early Glisson, MD    Family History History reviewed. No pertinent family history.  Social History Social History   Tobacco Use   Smoking status: Never   Smokeless tobacco: Never  Vaping Use   Vaping status: Never Used  Substance Use  Topics   Alcohol use: Never   Drug use: Never     Allergies   Patient has no known allergies.   Review of Systems Review of Systems Per HPI  Physical Exam Triage Vital Signs ED Triage Vitals  Encounter Vitals Group     BP 01/01/24 1255 123/83     Girls Systolic BP Percentile --      Girls Diastolic BP Percentile --      Boys Systolic BP Percentile --      Boys Diastolic BP Percentile --      Pulse Rate 01/01/24 1255 90     Resp 01/01/24 1255 20     Temp 01/01/24 1255 97.7 F (36.5 C)     Temp Source 01/01/24 1255 Oral     SpO2 01/01/24 1255 100 %     Weight --      Height --      Head Circumference --       Peak Flow --      Pain Score 01/01/24 1254 8     Pain Loc --      Pain Education --      Exclude from Growth Chart --    No data found.  Updated Vital Signs BP 123/83 (BP Location: Right Arm)   Pulse 90   Temp 97.7 F (36.5 C) (Oral)   Resp 20   SpO2 100%   Visual Acuity Right Eye Distance:   Left Eye Distance:   Bilateral Distance:    Right Eye Near:   Left Eye Near:    Bilateral Near:     Physical Exam Vitals and nursing note reviewed.  Constitutional:      General: He is not in acute distress.    Appearance: Normal appearance. He is not toxic-appearing.  HENT:     Mouth/Throat:     Mouth: Mucous membranes are moist.     Pharynx: Oropharynx is clear.   Cardiovascular:     Rate and Rhythm: Normal rate and regular rhythm.  Pulmonary:     Effort: Pulmonary effort is normal. No respiratory distress.     Breath sounds: Normal breath sounds. No wheezing, rhonchi or rales.  Abdominal:     General: Abdomen is flat. Bowel sounds are normal. There is no distension.     Palpations: Abdomen is soft.     Tenderness: There is no abdominal tenderness. There is no right CVA tenderness, left CVA tenderness or guarding.   Skin:    General: Skin is warm and dry.     Coloration: Skin is not jaundiced or pale.     Findings: No erythema.   Neurological:     Mental Status: He is alert and oriented to person, place, and time.   Psychiatric:        Behavior: Behavior is cooperative.      UC Treatments / Results  Labs (all labs ordered are listed, but only abnormal results are displayed) Labs Reviewed  POCT URINALYSIS DIP (MANUAL ENTRY) - Abnormal; Notable for the following components:      Result Value   Bilirubin, UA moderate (*)    Spec Grav, UA <=1.005 (*)    Blood, UA trace-lysed (*)    Protein Ur, POC trace (*)    All other components within normal limits  URINE CULTURE    EKG   Radiology No results found.  Procedures Procedures (including critical  care time)  Medications Ordered in UC Medications - No data to display  Initial Impression / Assessment and Plan / UC Course  I have reviewed the triage vital signs and the nursing notes.  Pertinent labs & imaging results that were available during my care of the patient were reviewed by me and considered in my medical decision making (see chart for details).   Patient is well-appearing, normotensive, afebrile, not tachycardic, not tachypneic, oxygenating well on room air.   1. Flank pain 2. Other fatigue Vitals and examination are reassuring Unclear cause of chronic fatigue-likely multifactorial given patient's chronic conditions Patient walks from the exam room to the parking lot multiple times throughout visit today without difficulty Urinalysis today shows small amount of blood, otherwise is unremarkable Urine culture is pending Low suspicion for kidney stone or UTI/pyelonephritis today, therefore treatment deferred until urine culture results I offered blood work, however patient decided to leave prior to blood work being obtained Recommended follow-up with PCP and specialists as planned Strict ER precautions discussed  Patient left urgent care AGAINST MEDICAL ADVICE abruptly after provider left the room stating, I do not want my blood work taken anymore and I am leaving.  Clinical staff was unable to obtain signature on AMA paperwork.    Total time spent on this patient encounter today was 50 minutes. This includes 20 minutes of face-to-face time with the patient, 25 minutes reviewing the patient's chart before the visit, and 5 minutes of documentation after the patient left. Final Clinical Impressions(s) / UC Diagnoses   Final diagnoses:  Flank pain  Other fatigue   Discharge Instructions   None    ED Prescriptions   None    PDMP not reviewed this encounter.   Wilhemena Harbour, NP 01/01/24 [phone]

## 2024-01-02 LAB — URINE CULTURE: Culture: NO GROWTH

## 2024-01-03 ENCOUNTER — Ambulatory Visit (HOSPITAL_COMMUNITY): Payer: Self-pay

## 2024-01-28 ENCOUNTER — Other Ambulatory Visit: Payer: Self-pay

## 2024-01-28 ENCOUNTER — Ambulatory Visit: Admission: EM | Admit: 2024-01-28 | Discharge: 2024-01-28 | Disposition: A | Discharge status: Home / Self Care

## 2024-01-28 DIAGNOSIS — R079 Chest pain, unspecified: Secondary | ICD-10-CM | POA: Diagnosis not present

## 2024-01-28 NOTE — ED Triage Notes (Signed)
 Pt reports he has had left side chest pain that radiated down to his left arm about an hur ago. States he checked his BP and it was 160/ 97.

## 2024-01-28 NOTE — Discharge Instructions (Signed)
Patient declines AVS. 

## 2024-01-28 NOTE — ED Provider Notes (Signed)
 [facility]    CSN: [id number] Arrival date & time: 01/28/24  1608      History   Chief Complaint Chief Complaint  Patient presents with   Chest Pain    HPI Dalton Guerrero is a 63 y.o. male.   The history is provided by the patient.   Patient presents for complaints of chest pain that started today while he was lying down.  Patient states that the pain started then began to feel like a pressure in his chest.  He states that the pain ran down the right arm and then moved down the left arm.  States that his blood pressure was also elevated, states when he checked it at home it was 160/97.  Patient denies pain at present.  States that his pain has somewhat improved.  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  Patient denies prior history of heart disease.  States that he does have underlying history of kidney disease and kidney cancer.  Patient also reports prior history of sarcoidosis.  Past Medical History:  Diagnosis Date   Aortic valve sclerosis    BPH (benign prostatic hyperplasia)    Chronic kidney disease    Hyperlipidemia    Hypertension    Primary osteoarthritis of right shoulder    Pulmonary sarcoidosis (HCC)    Renal cell carcinoma, right (HCC)    Renal disorder     There are no active problems to display for this patient.   Past Surgical History:  Procedure Laterality Date   KIDNEY SURGERY Bilateral    Pt. states they took the cancer out   PELVIS CLOSED REDUCTION         Home Medications    Prior to Admission medications   Medication Sig Start Date End Date Taking? Authorizing Provider  amitriptyline (ELAVIL) 50 MG tablet Take 50 mg by mouth at bedtime. 04/16/23   [provider]  amLODipine (NORVASC) 10 MG tablet Take 10 mg by mouth daily. 03/01/23   [provider]  aspirin 81 MG chewable tablet Chew 81 mg by mouth daily. 04/18/23   [provider]  atorvastatin (LIPITOR) 40 MG tablet Take 1 tablet by mouth daily. 04/18/23   [provider]  hydrochlorothiazide (HYDRODIURIL) 25 MG tablet Take 25 mg by mouth daily. 04/16/23   [provider]  lisinopril (ZESTRIL) 10 MG tablet Take 10 mg by mouth daily.    [provider]  LYRICA 25 MG capsule Take 25 mg by mouth daily. 07/30/23   [provider]  ondansetron  (ZOFRAN -ODT) 4 MG disintegrating tablet Take 1 tablet (4 mg total) by mouth every 8 (eight) hours as needed for nausea. 08/05/23   Cleotilde Rogue, MD  ondansetron  (ZOFRAN -ODT) 4 MG disintegrating tablet Take 1 tablet (4 mg total) by mouth every 8 (eight) hours as needed for nausea. 08/05/23   Cleotilde Rogue, MD    Family History History reviewed. No pertinent family history.  Social History Social History   Tobacco Use   Smoking status: Never   Smokeless tobacco: Never  Vaping Use   Vaping status: Never Used  Substance Use Topics   Alcohol use: Never   Drug use: Never     Allergies   Patient has no known allergies.   Review of Systems Review of Systems Per HPI  Physical Exam Triage Vital Signs ED Triage Vitals  Encounter Vitals Group  BP 01/28/24 1614 (!) 147/99     Girls Systolic BP Percentile --      Girls Diastolic BP Percentile --      Boys Systolic BP Percentile --      Boys Diastolic BP Percentile --      Pulse Rate 01/28/24 1614 99     Resp 01/28/24 1614 18     Temp 01/28/24 1614 98.3 F (36.8 C)     Temp Source 01/28/24 1614 Oral     SpO2 01/28/24 1614 97 %     Weight --      Height --      Head Circumference --      Peak Flow --      Pain Score 01/28/24 1615 0     Pain Loc --      Pain Education --      Exclude from Growth Chart --    No data found.  Updated Vital Signs BP (!) 147/99   Pulse 99   Temp 98.3 F (36.8 C) (Oral)   Resp 18   SpO2 97%   Visual Acuity Right Eye Distance:   Left Eye Distance:   Bilateral Distance:     Right Eye Near:   Left Eye Near:    Bilateral Near:     Physical Exam Vitals and nursing note reviewed.  Constitutional:      General: He is not in acute distress.    Appearance: Normal appearance.  HENT:     Head: Normocephalic.  Eyes:     Extraocular Movements: Extraocular movements intact.     Conjunctiva/sclera: Conjunctivae normal.     Pupils: Pupils are equal, round, and reactive to light.  Cardiovascular:     Rate and Rhythm: Regular rhythm.     Pulses: Normal pulses.     Heart sounds: Normal heart sounds.  Pulmonary:     Effort: Pulmonary effort is normal. No respiratory distress.     Breath sounds: Normal breath sounds. No stridor. No wheezing, rhonchi or rales.  Abdominal:     General: Bowel sounds are normal.     Palpations: Abdomen is soft.     Tenderness: There is no abdominal tenderness.  Musculoskeletal:     Cervical back: Normal range of motion.  Skin:    General: Skin is warm and dry.  Neurological:     General: No focal deficit present.     Mental Status: He is alert and oriented to person, place, and time.  Psychiatric:        Mood and Affect: Mood normal.        Behavior: Behavior normal.      UC Treatments / Results  Labs (all labs ordered are listed, but only abnormal results are displayed) Labs Reviewed - No data to display  EKG: NSR with RBBB, no STEMI. Compared to EKG dated 11/30/23, 04/15/2023, and 11/27/2022.   Radiology No results found.  Procedures Procedures (including critical care time)  Medications Ordered in UC Medications - No data to display  Initial Impression / Assessment and Plan / UC Course  I have reviewed the triage vital signs and the nursing notes.  Pertinent labs & imaging results that were available during my care of the patient were reviewed by me and considered in my medical decision making (see chart for details).  On exam, the patient was well-appearing, he is in no acute distress, his blood pressure is  slightly elevated at this time.  Patient left prior to repeat  of his blood pressure at discharge.  Patient declined chest x-ray and lab results, states that he will obtain this through his PCP.  Patient was given strict ER follow-up precautions.  Would like for patient to follow-up with his PCP for reevaluation within the next 3 to 5 days.  Supportive care recommendations were provided discussed with the patient to include fluids and rest.  Patient was in agreement with this plan of care and verbalizes understanding.  All questions were answered.  Patient stable for discharge.  Final Clinical Impressions(s) / UC Diagnoses   Final diagnoses:  None   Discharge Instructions   None    ED Prescriptions   None    PDMP not reviewed this encounter.   Gilmer Etta PARAS, NP 01/28/24 1707

## 2024-01-28 NOTE — ED Notes (Signed)
 Pt left prior to discharge instructions or care plan being discussed. Provider asked clinical staff to ask pt to wait a few more minutes that they had additional things to discuss. Pt stated I'm going home and go to bed, ill follow up with my primary care.   Pt left UC. NAD noted.

## 2024-05-17 ENCOUNTER — Ambulatory Visit
Admission: EM | Admit: 2024-05-17 | Discharge: 2024-05-17 | Disposition: A | Discharge status: Home / Self Care | Attending: Family Medicine | Admitting: Family Medicine

## 2024-05-17 DIAGNOSIS — U071 COVID-19: Secondary | ICD-10-CM

## 2024-05-17 LAB — POC SOFIA SARS ANTIGEN FIA: SARS Coronavirus 2 Ag: POSITIVE — AB

## 2024-05-17 MED ORDER — PAXLOVID (300/100) 20 X 150 MG & 10 X 100MG PO TBPK: 3.0000 | ORAL_TABLET | Freq: Two times a day (BID) | ORAL | 0 refills | Status: AC

## 2024-05-17 MED ORDER — BENZONATATE 200 MG PO CAPS: 200.0000 mg | ORAL_CAPSULE | Freq: Three times a day (TID) | ORAL | 0 refills | Status: AC | PRN

## 2024-05-17 MED ORDER — AZELASTINE HCL 0.1 % NA SOLN: 1.0000 | Freq: Two times a day (BID) | NASAL | 0 refills | Status: AC

## 2024-05-17 NOTE — ED Provider Notes (Signed)
 [facility]    CSN: [id number] Arrival date & time: 05/17/24  1451      History   Chief Complaint No chief complaint on file.   HPI Dalton Guerrero is a 63 y.o. male.   Patient presenting today with 1 day history of cough, sore throat, congestion.  Denies chest pain, shortness of breath, fever, chills, abdominal pain, vomiting, diarrhea.  So far trying Coricidin HBP with minimal relief.    Past Medical History:  Diagnosis Date   Aortic valve sclerosis    BPH (benign prostatic hyperplasia)    Chronic kidney disease    Hyperlipidemia    Hypertension    Primary osteoarthritis of right shoulder    Pulmonary sarcoidosis    Renal cell carcinoma, right (HCC)    Renal disorder     There are no active problems to display for this patient.   Past Surgical History:  Procedure Laterality Date   KIDNEY SURGERY Bilateral    Pt. states they took the cancer out   PELVIS CLOSED REDUCTION         Home Medications    Prior to Admission medications   Medication Sig Start Date End Date Taking? Authorizing Provider  azelastine (ASTELIN) 0.1 % nasal spray Place 1 spray into both nostrils 2 (two) times daily. Use in each nostril as directed 05/17/24  Yes Stuart Vernell Norris, PA-C  benzonatate (TESSALON) 200 MG capsule Take 1 capsule (200 mg total) by mouth 3 (three) times daily as needed for cough. 05/17/24  Yes Stuart Vernell Norris, PA-C  nirmatrelvir/ritonavir (PAXLOVID, 300/100,) 20 x 150 MG & 10 x 100MG  TBPK Take 3 tablets by mouth 2 (two) times daily for 5 days. Patient GFR is >60. Take nirmatrelvir (150 mg) two tablets twice daily for 5 days and ritonavir (100 mg) one tablet twice daily for 5 days. 05/17/24 05/22/24 Yes Stuart Vernell Norris, PA-C  amitriptyline (ELAVIL) 50 MG tablet Take 50 mg by mouth at bedtime. 04/16/23   [provider]  amLODipine (NORVASC) 10 MG tablet Take 10 mg by mouth daily. 03/01/23   [provider]  aspirin 81 MG  chewable tablet Chew 81 mg by mouth daily. 04/18/23   [provider]  atorvastatin (LIPITOR) 40 MG tablet Take 1 tablet by mouth daily. 04/18/23   [provider]  hydrochlorothiazide (HYDRODIURIL) 25 MG tablet Take 25 mg by mouth daily. 04/16/23   [provider]  lisinopril (ZESTRIL) 10 MG tablet Take 10 mg by mouth daily.    [provider]  LYRICA 25 MG capsule Take 25 mg by mouth daily. 07/30/23   [provider]  ondansetron  (ZOFRAN -ODT) 4 MG disintegrating tablet Take 1 tablet (4 mg total) by mouth every 8 (eight) hours as needed for nausea. 08/05/23   Cleotilde Rogue, MD  ondansetron  (ZOFRAN -ODT) 4 MG disintegrating tablet Take 1 tablet (4 mg total) by mouth every 8 (eight) hours as needed for nausea. 08/05/23   Cleotilde Rogue, MD    Family History History reviewed. No pertinent family history.  Social History Social History   Tobacco Use   Smoking status: Never   Smokeless tobacco: Never  Vaping Use   Vaping status: Never Used  Substance Use Topics   Alcohol use: Never   Drug use: Never     Allergies   Patient has no known allergies.   Review of Systems Review of Systems Per HPI  Physical Exam Triage Vital Signs ED Triage Vitals  Encounter Vitals Group  BP 05/17/24 1501 112/75     Girls Systolic BP Percentile --      Girls Diastolic BP Percentile --      Boys Systolic BP Percentile --      Boys Diastolic BP Percentile --      Pulse Rate 05/17/24 1501 94     Resp 05/17/24 1501 20     Temp 05/17/24 1501 98.5 F (36.9 C)     Temp Source 05/17/24 1501 Oral     SpO2 05/17/24 1501 98 %     Weight --      Height --      Head Circumference --      Peak Flow --      Pain Score 05/17/24 1502 0     Pain Loc --      Pain Education --      Exclude from Growth Chart --    No data found.  Updated Vital Signs BP 112/75 (BP Location: Right Arm)   Pulse 94   Temp 98.5 F (36.9 C) (Oral)   Resp 20   SpO2 98%   Visual  Acuity Right Eye Distance:   Left Eye Distance:   Bilateral Distance:    Right Eye Near:   Left Eye Near:    Bilateral Near:     Physical Exam Vitals and nursing note reviewed.  Constitutional:      Appearance: He is well-developed.  HENT:     Head: Atraumatic.     Right Ear: Tympanic membrane and external ear normal.     Left Ear: Tympanic membrane and external ear normal.     Nose: Rhinorrhea present.     Mouth/Throat:     Pharynx: Posterior oropharyngeal erythema present. No oropharyngeal exudate.  Eyes:     Conjunctiva/sclera: Conjunctivae normal.     Pupils: Pupils are equal, round, and reactive to light.  Cardiovascular:     Rate and Rhythm: Normal rate and regular rhythm.  Pulmonary:     Effort: Pulmonary effort is normal. No respiratory distress.     Breath sounds: No wheezing or rales.  Musculoskeletal:        General: Normal range of motion.     Cervical back: Normal range of motion and neck supple.  Lymphadenopathy:     Cervical: No cervical adenopathy.  Skin:    General: Skin is warm and dry.  Neurological:     Mental Status: He is alert and oriented to person, place, and time.  Psychiatric:        Behavior: Behavior normal.      UC Treatments / Results  Labs (all labs ordered are listed, but only abnormal results are displayed) Labs Reviewed  POC SOFIA SARS ANTIGEN FIA - Abnormal; Notable for the following components:      Result Value   SARS Coronavirus 2 Ag Positive (*)    All other components within normal limits    EKG   Radiology No results found.  Procedures Procedures (including critical care time)  Medications Ordered in UC Medications - No data to display  Initial Impression / Assessment and Plan / UC Course  I have reviewed the triage vital signs and the nursing notes.  Pertinent labs & imaging results that were available during my care of the patient were reviewed by me and considered in my medical decision making (see chart  for details).     Vitals and exam reassuring today, rapid COVID-positive, will treat with Paxlovid, Astelin, Tessalon, supportive over-the-counter  medications and home care.  Return for worsening or unresolving symptoms.  Final Clinical Impressions(s) / UC Diagnoses   Final diagnoses:  COVID-19   Discharge Instructions   None    ED Prescriptions     Medication Sig Dispense Auth. Provider   nirmatrelvir/ritonavir (PAXLOVID, 300/100,) 20 x 150 MG & 10 x 100MG  TBPK Take 3 tablets by mouth 2 (two) times daily for 5 days. Patient GFR is >60. Take nirmatrelvir (150 mg) two tablets twice daily for 5 days and ritonavir (100 mg) one tablet twice daily for 5 days. 30 tablet Stuart Vernell Norris, PA-C   benzonatate (TESSALON) 200 MG capsule Take 1 capsule (200 mg total) by mouth 3 (three) times daily as needed for cough. 20 capsule Stuart Vernell Norris, PA-C   azelastine (ASTELIN) 0.1 % nasal spray Place 1 spray into both nostrils 2 (two) times daily. Use in each nostril as directed 30 mL Stuart Vernell Norris, PA-C      PDMP not reviewed this encounter.   Stuart Vernell Norris, [place] 05/17/24 1555

## 2024-05-17 NOTE — ED Triage Notes (Addendum)
 Pt reports weird tasted in the mouth, cough, sore throat x 1 day. Has taken Coridin HBP for cough.
